# Patient Record
Sex: Female | Born: 1980
Health system: Southern US, Community
[De-identification: ages and names within clinical notes are randomized; demographics above are authoritative.]

## PROBLEM LIST (undated history)

## (undated) DIAGNOSIS — F419 Anxiety disorder, unspecified: Secondary | ICD-10-CM

## (undated) DIAGNOSIS — D649 Anemia, unspecified: Secondary | ICD-10-CM

## (undated) DIAGNOSIS — Z87442 Personal history of urinary calculi: Secondary | ICD-10-CM

## (undated) DIAGNOSIS — Z789 Other specified health status: Secondary | ICD-10-CM

## (undated) HISTORY — PX: KNEE SURGERY: SHX244

## (undated) HISTORY — PX: WISDOM TOOTH EXTRACTION: SHX21

---
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
## 2001-10-30 ENCOUNTER — Other Ambulatory Visit: Admission: RE | Admit: 2001-10-30 | Discharge: 2001-10-30 | Payer: Self-pay | Admitting: Family Medicine

## 2002-01-04 ENCOUNTER — Encounter: Payer: Self-pay | Admitting: Family Medicine

## 2002-01-04 ENCOUNTER — Encounter: Admission: RE | Admit: 2002-01-04 | Discharge: 2002-01-04 | Payer: Self-pay | Admitting: Family Medicine

## 2002-12-26 ENCOUNTER — Other Ambulatory Visit: Admission: RE | Admit: 2002-12-26 | Discharge: 2002-12-26 | Payer: Self-pay | Admitting: Family Medicine

## 2002-12-26 ENCOUNTER — Other Ambulatory Visit: Admission: RE | Admit: 2002-12-26 | Discharge: 2002-12-26 | Payer: Self-pay | Admitting: *Deleted

## 2004-02-12 ENCOUNTER — Other Ambulatory Visit: Admission: RE | Admit: 2004-02-12 | Discharge: 2004-02-12 | Payer: Self-pay | Admitting: Family Medicine

## 2005-04-07 ENCOUNTER — Other Ambulatory Visit: Admission: RE | Admit: 2005-04-07 | Discharge: 2005-04-07 | Payer: Self-pay | Admitting: Family Medicine

## 2006-04-19 ENCOUNTER — Ambulatory Visit (HOSPITAL_COMMUNITY): Admission: RE | Admit: 2006-04-19 | Discharge: 2006-04-19 | Payer: Self-pay | Admitting: Obstetrics and Gynecology

## 2006-07-11 ENCOUNTER — Inpatient Hospital Stay (HOSPITAL_COMMUNITY): Admission: AD | Admit: 2006-07-11 | Discharge: 2006-07-15 | Payer: Self-pay | Admitting: Obstetrics and Gynecology

## 2006-07-12 ENCOUNTER — Encounter (HOSPITAL_COMMUNITY): Payer: Self-pay | Admitting: Obstetrics and Gynecology

## 2007-06-07 ENCOUNTER — Encounter: Admission: RE | Admit: 2007-06-07 | Discharge: 2007-07-30 | Payer: Self-pay | Admitting: Orthopedic Surgery

## 2010-07-09 NOTE — Discharge Summary (Signed)
Julia Burton, Julia Burton             ACCOUNT NO.:  [id number]   MEDICAL RECORD NO.:  [id number]          PATIENT TYPE:  INP   LOCATION:  9107                          FACILITY:  [facility]   PHYSICIAN:  Juluis Mire, M.D.   DATE OF BIRTH:  1981/01/19   DATE OF ADMISSION:  07/11/2006  DATE OF DISCHARGE:  07/15/2006                               DISCHARGE SUMMARY   ADMISSION DIAGNOSES:  1. Intrauterine pregnancy at 40 weeks estimated gestational age.  2. Induction of labor secondary to suspected large for gestational age      infant.   DISCHARGE DIAGNOSIS:  Status post low transverse cesarean section  secondary to failed induction with a viable female infant.   PROCEDURE:  Primary low transverse cesarean section.   REASON FOR ADMISSION:  Please see written H&P.   HOSPITAL COURSE:  This the 30 year old primigravida who was admitted to  [facility] at 40 weeks' estimated gestational age for  induction of labor.  The patient had been seen in the office where  estimated fetal weight was greater than 3800 grams with a grade 3  placenta. On admission vital signs were stable.  Fetal heart tones were  reactive.  Cervix was noted to be closed on admission. The patient was  admitted and started on Pitocin. The patient did become uncomfortable  during the day and had requested an epidural.  She continued on Pitocin  and cervix was now 4-5 cm, 70% effaced, vertex at minus two station.  After several more hours the patient was now comfortable however, there  was no further change in cervix.  Fetal heart tones continued to be  reactive.  Decision was made to proceed with a primary low transverse  cesarean section.  The patient was then transferred to the operating  room where epidural was noted to an adequate surgical level.  Low  transverse incision was made with delivery of a viable female infant  weighing 8 pounds 5 ounces with Apgars of 9 at 1 minute and 9 at 5  minutes.  The patient  tolerated procedure well and taken to the recovery  room in stable condition.  On postoperative day #1 the patient was  without complaint.  Vital signs were stable.  Temperature max of 100.2  at 6:00 a.m. and 100.5 currently.  Abdomen soft with good return of  bowel function.  Fundus was firm and nontender.  Abdominal dressing was  noted to be clean, dry and intact.  Laboratory findings revealed  hemoglobin of 10.1, platelet count 197,000, WBC count of 16.3.  Temperature was rechecked 2 hours later noted to be 98.7.  Decision was  made just to continue to observe the patient. On postoperative day #2  the patient was without complaint.  Vital signs stable.  She is  afebrile.  Fundus firm and nontender.  Abdominal dressing been removed  revealing incision that is clean, dry and intact.  Postoperative day #3  the patient was without complaint.  Vital signs remained stable.  Fundus  firm and nontender.  Incision was clean, dry and intact.  Staples  removed and the  patient was later discharged home.   CONDITION ON DISCHARGE:  Stable, diet regular as tolerated.   ACTIVITY:  No heavy lifting, no driving x2 weeks.  No vaginal entry.   FOLLOW UP:  Patient follow up in the office in 1 week for an incision  check.  She is to call for temperature greater than 100 degrees,  persistent nausea, vomiting, heavy vaginal bleeding and/or redness or  drainage from the incisional site.   DISCHARGE MEDICATIONS:  1. Tylox #30 one p.o. every 4-6 hours.  2. Motrin 600 mg every 6 hours.  3. Prenatal vitamins one p.o. daily.      Julio Sicks, N.P.      Juluis Mire, M.D.  Electronically Signed    CC/MEDQ  D:  08/20/2006  T:  08/21/2006  Job:  [id number]

## 2010-07-09 NOTE — Op Note (Signed)
Julia Burton, Julia Burton             ACCOUNT NO.:  [id number]   MEDICAL RECORD NO.:  [id number]          PATIENT TYPE:  INP   LOCATION:  9107                          FACILITY:  [facility]   PHYSICIAN:  Zelphia Cairo, MD    DATE OF BIRTH:  1980/05/29   DATE OF PROCEDURE:  07/12/2006  DATE OF DISCHARGE:                               OPERATIVE REPORT   PREOPERATIVE DIAGNOSIS:  1. Failed induction.  2. Intrauterine pregnancy at 40 weeks.   POSTOPERATIVE DIAGNOSIS:  1. Failed induction.  2. Intrauterine pregnancy at 40 weeks.   PROCEDURE:  Primary low transverse cesarean delivery.   SURGEON:  Dr. Renaldo Fiddler   ASSISTANT:  None.   ANESTHESIA:  Epidural.   SPECIMEN:  Placenta to pathology.   FINDINGS:  Viable female infant with Apgars of nine and nine, weight 8  pounds 5 ounces, normal-appearing pelvic anatomy.  Estimated blood loss  500 mL.  Complications none.  Condition stable to recovery room.   PROCEDURE:  The patient was taken to the operating room where epidural  was found to be adequate.  She was prepped and draped in sterile  fashion.  A Pfannenstiel skin incision was made with a scalpel and  carried down to the underlying fascia.  The fascia was incised in the  midline and extended laterally using Mayo scissors.  Kocher clamps were  used to grasp the anterior portion of the fascia.  This was tented  upwards and the underlying rectus muscles were dissected off using the  Bovie.  The inferior portion of the fascia was then tented upwards with  Kocher clamps and the underlying rectus dissected off with the Bovie.  Peritoneum was then identified and entered bluntly.  This was extended  using surgeon and assistants hands.  Bladder blade was then inserted and  the vesicouterine peritoneum was incised and the bladder flap was  created.  The bladder blade was reinserted to protect the bladder flap.  Uterine incision was then made with a scalpel and extended bluntly.  The  fetal vertex  was brought to the uterine incision. Due to inability to  deliver the fetal vertex with fundal pressure alone the vacuum was  applied. Gentle traction was applied upward on the fetal vertex along  with fundal pressure and the fetal vertex was easily delivered. The  vacuum was removed.  Nuchal cord times one was easily reduced.  The  shoulders and body then followed. The mouth and nose were suctioned as  the cord was clamped and cut. The infant was taken to the awaiting  pediatric staff.   The placenta was then manually removed from the uterus.  The uterus was  then exteriorized from the pelvis and cleared of all clots and debris  using a dry lap sponge.  The uterine incision was then closed using 1-0  chromic in a running locked fashion.  Hemostasis was assured and the  uterus was placed back into the pelvic cavity.  The pelvis was copiously  irrigated with warm normal saline.  The uterine incision was reinspected  to assure hemostasis.  Once  hemostasis was assured.  The peritoneum was reapproximated using Vicryl.  The fascia was closed with 0 Monocryl and the skin was closed with  staples.  The patient tolerated the procedure well.  Sponge, lap, needle  and instrument counts were correct x2.  She was given Ancef at cord  clamp.      Zelphia Cairo, MD  Electronically Signed     GA/MEDQ  D:  07/12/2006  T:  07/12/2006  Job:  [id number]

## 2010-09-07 ENCOUNTER — Other Ambulatory Visit: Payer: Self-pay | Admitting: Physician Assistant

## 2010-09-07 DIAGNOSIS — D239 Other benign neoplasm of skin, unspecified: Secondary | ICD-10-CM

## 2010-09-07 HISTORY — DX: Other benign neoplasm of skin, unspecified: D23.9

## 2011-10-31 ENCOUNTER — Ambulatory Visit: Payer: Self-pay | Admitting: Physical Therapy

## 2013-01-18 ENCOUNTER — Encounter (INDEPENDENT_AMBULATORY_CARE_PROVIDER_SITE_OTHER): Payer: Self-pay

## 2013-01-18 ENCOUNTER — Ambulatory Visit (INDEPENDENT_AMBULATORY_CARE_PROVIDER_SITE_OTHER): Payer: BC Managed Care – PPO | Admitting: General Practice

## 2013-01-18 ENCOUNTER — Encounter: Payer: Self-pay | Admitting: General Practice

## 2013-01-18 VITALS — BP 110/73 | HR 80 | Temp 97.8°F | Ht 68.0 in | Wt 186.0 lb

## 2013-01-18 DIAGNOSIS — J020 Streptococcal pharyngitis: Secondary | ICD-10-CM

## 2013-01-18 DIAGNOSIS — J029 Acute pharyngitis, unspecified: Secondary | ICD-10-CM

## 2013-01-18 LAB — POCT RAPID STREP A (OFFICE): Rapid Strep A Screen: POSITIVE — AB

## 2013-01-18 MED ORDER — AMOXICILLIN 875 MG PO TABS: 875.0000 mg | ORAL_TABLET | Freq: Two times a day (BID) | ORAL | Status: DC

## 2013-01-18 NOTE — Patient Instructions (Signed)
Strep Throat  Strep throat is an infection of the throat caused by a bacteria named Streptococcus pyogenes. Your caregiver may call the infection streptococcal "tonsillitis" or "pharyngitis" depending on whether there are signs of inflammation in the tonsils or back of the throat. Strep throat is most common in children aged 31 15 years during the cold months of the year, but it can occur in people of any age during any season. This infection is spread from person to person (contagious) through coughing, sneezing, or other close contact.  SYMPTOMS   · Fever or chills.  · Painful, swollen, red tonsils or throat.  · Pain or difficulty when swallowing.  · White or yellow spots on the tonsils or throat.  · Swollen, tender lymph nodes or "glands" of the neck or under the jaw.  · Red rash all over the body (rare).  DIAGNOSIS   Many different infections can cause the same symptoms. A test must be done to confirm the diagnosis so the right treatment can be given. A "rapid strep test" can help your caregiver make the diagnosis in a few minutes. If this test is not available, a light swab of the infected area can be used for a throat culture test. If a throat culture test is done, results are usually available in a day or two.  TREATMENT   Strep throat is treated with antibiotic medicine.  HOME CARE INSTRUCTIONS   · Gargle with 1 tsp of salt in 1 cup of warm water, 3 4 times per day or as needed for comfort.  · Family members who also have a sore throat or fever should be tested for strep throat and treated with antibiotics if they have the strep infection.  · Make sure everyone in your household washes their hands well.  · Do not share food, drinking cups, or personal items that could cause the infection to spread to others.  · You may need to eat a soft food diet until your sore throat gets better.  · Drink enough water and fluids to keep your urine clear or pale yellow. This will help prevent dehydration.  · Get plenty of  rest.  · Stay home from school, daycare, or work until you have been on antibiotics for 24 hours.  · Only take over-the-counter or prescription medicines for pain, discomfort, or fever as directed by your caregiver.  · If antibiotics are prescribed, take them as directed. Finish them even if you start to feel better.  SEEK MEDICAL CARE IF:   · The glands in your neck continue to enlarge.  · You develop a rash, cough, or earache.  · You cough up green, yellow-brown, or bloody sputum.  · You have pain or discomfort not controlled by medicines.  · Your problems seem to be getting worse rather than better.  SEEK IMMEDIATE MEDICAL CARE IF:   · You develop any new symptoms such as vomiting, severe headache, stiff or painful neck, chest pain, shortness of breath, or trouble swallowing.  · You develop severe throat pain, drooling, or changes in your voice.  · You develop swelling of the neck, or the skin on the neck becomes red and tender.  · You have a fever.  · You develop signs of dehydration, such as fatigue, dry mouth, and decreased urination.  · You become increasingly sleepy, or you cannot wake up completely.  Document Released: 02/05/2000 Document Revised: 01/25/2012 Document Reviewed: 04/08/2010  ExitCare® Patient Information ©2014 [employer].

## 2013-01-18 NOTE — Progress Notes (Signed)
   Subjective:    Patient ID: Julia Burton, female    DOB: Apr 09, 1980, 32 y.o.   MRN: [id number]  Sore Throat  This is a new problem. The current episode started in the past 7 days. The problem has been gradually worsening. Neither side of throat is experiencing more pain than the other. There has been no fever. The pain is at a severity of 7/10. The pain is moderate. Pertinent negatives include no abdominal pain, congestion, coughing, diarrhea, ear pain, headaches, plugged ear sensation, shortness of breath, trouble swallowing or vomiting. She has tried cool liquids for the symptoms. The treatment provided no relief.      Review of Systems  Constitutional: Positive for fever. Negative for chills.       Low grade fever 100.1  HENT: Positive for sore throat. Negative for congestion, ear pain, postnasal drip, sinus pressure and trouble swallowing.   Respiratory: Negative for cough, chest tightness and shortness of breath.   Cardiovascular: Negative for chest pain and palpitations.  Gastrointestinal: Negative for vomiting, abdominal pain and diarrhea.  Neurological: Negative for dizziness, weakness and headaches.       Objective:   Physical Exam  Constitutional: She is oriented to person, place, and time. She appears well-developed and well-nourished.  HENT:  Head: Normocephalic and atraumatic.  Right Ear: External ear normal.  Left Ear: External ear normal.  Nose: Nose normal.  Mouth/Throat: Posterior oropharyngeal erythema present.  Cardiovascular: Normal rate, regular rhythm and normal heart sounds.   Pulmonary/Chest: Effort normal and breath sounds normal. No respiratory distress. She exhibits no tenderness.  Neurological: She is alert and oriented to person, place, and time.  Skin: Skin is warm and dry.  Psychiatric: She has a normal mood and affect.          Assessment & Plan:  1. Sore throat  - POCT rapid strep A  2. Strep throat  - amoxicillin (AMOXIL) 875  MG tablet; Take 1 tablet (875 mg total) by mouth 2 (two) times daily.  Dispense: 20 tablet; Refill: 0 -Increase fluid intake Motrin or tylenol OTC Throat lozenges if help New toothbrush in 3 days Proper handwashing RTO if symptoms worsen or unresolved Patient verbalized understanding Coralie Keens, FNP-C

## 2013-04-01 LAB — OB RESULTS CONSOLE ABO/RH: RH Type: NEGATIVE

## 2013-04-01 LAB — OB RESULTS CONSOLE HEPATITIS B SURFACE ANTIGEN: Hepatitis B Surface Ag: NEGATIVE

## 2013-04-01 LAB — OB RESULTS CONSOLE RPR: RPR: NONREACTIVE

## 2013-04-01 LAB — OB RESULTS CONSOLE GC/CHLAMYDIA
CHLAMYDIA, DNA PROBE: NEGATIVE
GC PROBE AMP, GENITAL: NEGATIVE

## 2013-04-01 LAB — OB RESULTS CONSOLE HIV ANTIBODY (ROUTINE TESTING): HIV: NONREACTIVE

## 2013-04-01 LAB — OB RESULTS CONSOLE RUBELLA ANTIBODY, IGM: RUBELLA: IMMUNE

## 2013-10-18 ENCOUNTER — Inpatient Hospital Stay (HOSPITAL_COMMUNITY)
Admission: AD | Admit: 2013-10-18 | Discharge: 2013-10-18 | Disposition: A | Discharge status: Home / Self Care | Payer: BC Managed Care – PPO | Source: Ambulatory Visit | Attending: Obstetrics and Gynecology | Admitting: Obstetrics and Gynecology

## 2013-10-18 ENCOUNTER — Encounter (HOSPITAL_COMMUNITY): Payer: Self-pay | Admitting: *Deleted

## 2013-10-18 DIAGNOSIS — O2390 Unspecified genitourinary tract infection in pregnancy, unspecified trimester: Secondary | ICD-10-CM | POA: Diagnosis not present

## 2013-10-18 DIAGNOSIS — M545 Low back pain, unspecified: Secondary | ICD-10-CM | POA: Insufficient documentation

## 2013-10-18 DIAGNOSIS — O26839 Pregnancy related renal disease, unspecified trimester: Secondary | ICD-10-CM | POA: Diagnosis not present

## 2013-10-18 DIAGNOSIS — N390 Urinary tract infection, site not specified: Secondary | ICD-10-CM

## 2013-10-18 DIAGNOSIS — O1213 Gestational proteinuria, third trimester: Secondary | ICD-10-CM

## 2013-10-18 DIAGNOSIS — O2343 Unspecified infection of urinary tract in pregnancy, third trimester: Secondary | ICD-10-CM

## 2013-10-18 HISTORY — DX: Anemia, unspecified: D64.9

## 2013-10-18 LAB — COMPREHENSIVE METABOLIC PANEL
ALBUMIN: 2.9 g/dL — AB (ref 3.5–5.2)
ALT: 10 U/L (ref 0–35)
ANION GAP: 14 (ref 5–15)
AST: 14 U/L (ref 0–37)
Alkaline Phosphatase: 115 U/L (ref 39–117)
BILIRUBIN TOTAL: 0.3 mg/dL (ref 0.3–1.2)
BUN: 6 mg/dL (ref 6–23)
CO2: 21 mEq/L (ref 19–32)
Calcium: 9.1 mg/dL (ref 8.4–10.5)
Chloride: 101 mEq/L (ref 96–112)
Creatinine, Ser: 0.61 mg/dL (ref 0.50–1.10)
GFR calc Af Amer: >90 mL/min (ref >90)
GFR calc non Af Amer: >90 mL/min (ref >90)
Glucose, Bld: 70 mg/dL (ref 70–99)
Potassium: 4.2 mEq/L (ref 3.7–5.3)
SODIUM: 136 meq/L — AB (ref 137–147)
TOTAL PROTEIN: 6 g/dL (ref 6.0–8.3)

## 2013-10-18 LAB — URINE MICROSCOPIC-ADD ON

## 2013-10-18 LAB — URINALYSIS, ROUTINE W REFLEX MICROSCOPIC
Bilirubin Urine: NEGATIVE
Glucose, UA: NEGATIVE mg/dL
Ketones, ur: >80 mg/dL — AB
Nitrite: NEGATIVE
Protein, ur: 100 mg/dL — AB
Specific Gravity, Urine: 1.025 (ref 1.005–1.030)
Urobilinogen, UA: 1 mg/dL (ref 0.0–1.0)
pH: 6.5 (ref 5.0–8.0)

## 2013-10-18 LAB — CBC WITH DIFFERENTIAL/PLATELET
BASOS PCT: 0 % (ref 0–1)
Basophils Absolute: 0 10*3/uL (ref 0.0–0.1)
EOS ABS: 0 10*3/uL (ref 0.0–0.7)
Eosinophils Relative: 0 % (ref 0–5)
HCT: 35.3 % — ABNORMAL LOW (ref 36.0–46.0)
Hemoglobin: 11.6 g/dL — ABNORMAL LOW (ref 12.0–15.0)
Lymphocytes Relative: 14 % (ref 12–46)
Lymphs Abs: 2.3 10*3/uL (ref 0.7–4.0)
MCH: 30.8 pg (ref 26.0–34.0)
MCHC: 32.9 g/dL (ref 30.0–36.0)
MCV: 93.6 fL (ref 78.0–100.0)
Monocytes Absolute: 1.2 10*3/uL — ABNORMAL HIGH (ref 0.1–1.0)
Monocytes Relative: 8 % (ref 3–12)
NEUTROS ABS: 12.7 10*3/uL — AB (ref 1.7–7.7)
NEUTROS PCT: 78 % — AB (ref 43–77)
PLATELETS: 223 10*3/uL (ref 150–400)
RBC: 3.77 MIL/uL — AB (ref 3.87–5.11)
RDW: 13.7 % (ref 11.5–15.5)
WBC: 16.2 10*3/uL — ABNORMAL HIGH (ref 4.0–10.5)

## 2013-10-18 LAB — LACTATE DEHYDROGENASE: LDH: 145 U/L (ref 94–250)

## 2013-10-18 MED ORDER — AMOXICILLIN 875 MG PO TABS: 875.0000 mg | ORAL_TABLET | Freq: Two times a day (BID) | ORAL | Status: DC

## 2013-10-18 NOTE — Discharge Instructions (Signed)
Asymptomatic Bacteriuria Asymptomatic bacteriuria is the presence of a large number of bacteria in your urine without the usual symptoms of burning or frequent urination. The following conditions increase the risk of asymptomatic bacteriuria:  Diabetes mellitus.  Advanced age.  Pregnancy in the first trimester.  Kidney stones.  Kidney transplants.  Leaky kidney tube valve in young children (reflux). Treatment for this condition is not needed in most people and can lead to other problems such as too much yeast and growth of resistant bacteria. However, some people, such as pregnant women, do need treatment to prevent kidney infection. Asymptomatic bacteriuria in pregnancy is also associated with fetal growth restriction, premature labor, and newborn death. HOME CARE INSTRUCTIONS Monitor your condition for any changes. The following actions may help to relieve any discomfort you are feeling:  Drink enough water and fluids to keep your urine clear or pale yellow. Go to the bathroom more often to keep your bladder empty.  Keep the area around your vagina and rectum clean. Wipe yourself from front to back after urinating. SEEK IMMEDIATE MEDICAL CARE IF:  You develop signs of an infection such as:  Burning with urination.  Frequency of voiding.  Back pain.  Fever.  You have blood in the urine.  You develop a fever. MAKE SURE YOU:  Understand these instructions.  Will watch your condition.  Will get help right away if you are not doing well or get worse. Document Released: 02/07/2005 Document Revised: 06/24/2013 Document Reviewed: 07/30/2012 ExitCare Patient Information 2015 [employer]. This information is not intended to replace advice given to you by your health care provider. Make sure you discuss any questions you have with your health care provider.  

## 2013-10-18 NOTE — MAU Note (Signed)
Lower back pain x 2 days, noted blood in toilet with voiding this afternoon.  Was advised by office nurse to come to MAU.  Having occasional uc's.  Decreased FM today.

## 2013-10-18 NOTE — MAU Provider Note (Signed)
History     CSN: [id number]  Arrival date and time: 10/18/13 1558   None     Chief Complaint  Patient presents with  . Back Pain  . Vaginal Bleeding   HPI Julia Burton is 33 y.o. G2P1001 [redacted]w[redacted]d weeks presenting with report of seeing blood on tissue after lunch.  Same thing happened last week but today it continues.  Is having some abdominal and lower back pain  She is a patient of Dr. Christen Butter.  Has C-Section planned for 11/06/13. Had spotting early in pregnancy but otherwise, uncomplicated per patient report.  Had cervix check in office 4 days ago-told it was soft but not dilated.  Hx of kidney stone X 1.    Past Medical History  Diagnosis Date  . Anemia     Past Surgical History  Procedure Laterality Date  . Cesarean section    . Knee surgery      History reviewed. No pertinent family history.  History  Substance Use Topics  . Smoking status: Never Smoker   . Smokeless tobacco: Not on file  . Alcohol Use: No    Allergies: No Known Allergies  Prescriptions prior to admission  Medication Sig Dispense Refill  . amoxicillin (AMOXIL) 875 MG tablet Take 1 tablet (875 mg total) by mouth 2 (two) times daily.  20 tablet  0    Review of Systems  Constitutional: Negative for fever and chills.  Eyes: Negative for blurred vision, double vision and photophobia.  Cardiovascular: Negative for chest pain.       Mild pedal edema  Genitourinary:       Blood on tissue after urination several time this afternoon.  Neurological: Positive for headaches (mild this am).   Physical Exam   Blood pressure 125/83, pulse 98, temperature 98.1 F (36.7 C), temperature source Oral, resp. rate 18, height 5\' 8"  (1.727 m), weight 215 lb (97.523 kg).  Physical Exam  Constitutional: She is oriented to person, place, and time. She appears well-developed and well-nourished.  HENT:  Head: Normocephalic.  Neck: Normal range of motion.  Cardiovascular: Normal rate.   Respiratory: Effort  normal.  Genitourinary:  Cervical exam by Cecille Rubin, RN--soft 1 cm dilated.  Musculoskeletal: She exhibits edema (mild pedal edema).  Neurological: She is alert and oriented to person, place, and time.  Skin: Skin is warm and dry.  Psychiatric: She has a normal mood and affect. Her behavior is normal.   Results for orders placed during the hospital encounter of 10/18/13 (from the past 24 hour(s))  URINALYSIS, ROUTINE W REFLEX MICROSCOPIC     Status: Abnormal   Collection Time    10/18/13  4:25 PM      Result Value Ref Range   Color, Urine BROWN (*) YELLOW   APPearance HAZY (*) CLEAR   Specific Gravity, Urine 1.025  1.005 - 1.030   pH 6.5  5.0 - 8.0   Glucose, UA NEGATIVE  NEGATIVE mg/dL   Hgb urine dipstick LARGE (*) NEGATIVE   Bilirubin Urine NEGATIVE  NEGATIVE   Ketones, ur >80 (*) NEGATIVE mg/dL   Protein, ur 100 (*) NEGATIVE mg/dL   Urobilinogen, UA 1.0  0.0 - 1.0 mg/dL   Nitrite NEGATIVE  NEGATIVE   Leukocytes, UA TRACE (*) NEGATIVE  URINE MICROSCOPIC-ADD ON     Status: Abnormal   Collection Time    10/18/13  4:25 PM      Result Value Ref Range   Squamous Epithelial / LPF MANY (*) RARE  Urine-Other FIELD OBSCURED BY RBC'S    CBC WITH DIFFERENTIAL     Status: Abnormal   Collection Time    10/18/13  6:30 PM      Result Value Ref Range   WBC 16.2 (*) 4.0 - 10.5 K/uL   RBC 3.77 (*) 3.87 - 5.11 MIL/uL   Hemoglobin 11.6 (*) 12.0 - 15.0 g/dL   HCT 35.3 (*) 36.0 - 46.0 %   MCV 93.6  78.0 - 100.0 fL   MCH 30.8  26.0 - 34.0 pg   MCHC 32.9  30.0 - 36.0 g/dL   RDW 13.7  11.5 - 15.5 %   Platelets 223  150 - 400 K/uL   Neutrophils Relative % 78 (*) 43 - 77 %   Neutro Abs 12.7 (*) 1.7 - 7.7 K/uL   Lymphocytes Relative 14  12 - 46 %   Lymphs Abs 2.3  0.7 - 4.0 K/uL   Monocytes Relative 8  3 - 12 %   Monocytes Absolute 1.2 (*) 0.1 - 1.0 K/uL   Eosinophils Relative 0  0 - 5 %   Eosinophils Absolute 0.0  0.0 - 0.7 K/uL   Basophils Relative 0  0 - 1 %   Basophils Absolute 0.0  0.0 -  0.1 K/uL  COMPREHENSIVE METABOLIC PANEL     Status: Abnormal   Collection Time    10/18/13  6:30 PM      Result Value Ref Range   Sodium 136 (*) 137 - 147 mEq/L   Potassium 4.2  3.7 - 5.3 mEq/L   Chloride 101  96 - 112 mEq/L   CO2 21  19 - 32 mEq/L   Glucose, Bld 70  70 - 99 mg/dL   BUN 6  6 - 23 mg/dL   Creatinine, Ser 0.61  0.50 - 1.10 mg/dL   Calcium 9.1  8.4 - 10.5 mg/dL   Total Protein 6.0  6.0 - 8.3 g/dL   Albumin 2.9 (*) 3.5 - 5.2 g/dL   AST 14  0 - 37 U/L   ALT 10  0 - 35 U/L   Alkaline Phosphatase 115  39 - 117 U/L   Total Bilirubin 0.3  0.3 - 1.2 mg/dL   GFR calc non Af Amer >90  >90 mL/min   GFR calc Af Amer >90  >90 mL/min   Anion gap 14  5 - 15  LACTATE DEHYDROGENASE     Status: None   Collection Time    10/18/13  6:30 PM      Result Value Ref Range   LDH 145  94 - 250 U/L   MAU Course  Procedures  Urine culture to lab  MDM Dr. Helane Rima was in unit, reported cervical exam and FMS at that time When UA results-called to report--orders given for aggressive po hydration, PIH labs and Urine culture.  If all ok, may discharge to home to hydrate well over the weekend and f/u in office on Monday.' 19:30  Reported labs to Dr. Helane Rima.  Treat for possible UTI with Amoxil 875mg  bid X 1 week.  Patient has appt on Monday afternoon with Dr. Corinna Capra.  Assessment and Plan  A:  Possible UTI      [redacted] week gestation      Proteinuria--PIH ruled out  P:  Urine culture pending       Instructed patient to call for headache, chest or leg pain, increased swelling, vaginal bleeding, LOF, or decreased fetal movement  Rx for Amoxil to pharmacy       Keep appt for Monday  Hartford Maulden,EVE M 10/18/2013, 7:25 PM

## 2013-10-20 ENCOUNTER — Inpatient Hospital Stay (HOSPITAL_COMMUNITY): Payer: BC Managed Care – PPO

## 2013-10-20 ENCOUNTER — Encounter (HOSPITAL_COMMUNITY): Payer: Self-pay

## 2013-10-20 ENCOUNTER — Inpatient Hospital Stay (HOSPITAL_COMMUNITY)
Admission: AD | Admit: 2013-10-20 | Discharge: 2013-10-20 | Disposition: A | Discharge status: Home / Self Care | Payer: BC Managed Care – PPO | Source: Ambulatory Visit | Attending: Obstetrics and Gynecology | Admitting: Obstetrics and Gynecology

## 2013-10-20 DIAGNOSIS — O9989 Other specified diseases and conditions complicating pregnancy, childbirth and the puerperium: Principal | ICD-10-CM

## 2013-10-20 DIAGNOSIS — R109 Unspecified abdominal pain: Secondary | ICD-10-CM | POA: Diagnosis present

## 2013-10-20 DIAGNOSIS — O99891 Other specified diseases and conditions complicating pregnancy: Secondary | ICD-10-CM | POA: Diagnosis not present

## 2013-10-20 DIAGNOSIS — Z87442 Personal history of urinary calculi: Secondary | ICD-10-CM | POA: Insufficient documentation

## 2013-10-20 HISTORY — DX: Personal history of urinary calculi: Z87.442

## 2013-10-20 LAB — URINALYSIS, ROUTINE W REFLEX MICROSCOPIC
BILIRUBIN URINE: NEGATIVE
Glucose, UA: NEGATIVE mg/dL
KETONES UR: NEGATIVE mg/dL
NITRITE: NEGATIVE
PH: 6.5 (ref 5.0–8.0)
PROTEIN: NEGATIVE mg/dL
Specific Gravity, Urine: <1.005 — ABNORMAL LOW (ref 1.005–1.030)
Urobilinogen, UA: 0.2 mg/dL (ref 0.0–1.0)

## 2013-10-20 LAB — CULTURE, OB URINE
Colony Count: 40000
SPECIAL REQUESTS: NORMAL

## 2013-10-20 LAB — URINE MICROSCOPIC-ADD ON

## 2013-10-20 MED ORDER — OXYCODONE-ACETAMINOPHEN 5-325 MG PO TABS
1.0000 | ORAL_TABLET | Freq: Once | ORAL | Status: AC
Start: 2013-10-20 — End: 2013-10-20
  Administered 2013-10-20: 1 via ORAL
  Filled 2013-10-20: qty 1

## 2013-10-20 MED ORDER — PROMETHAZINE HCL 25 MG PO TABS: 25.0000 mg | ORAL_TABLET | Freq: Four times a day (QID) | ORAL | Status: DC | PRN

## 2013-10-20 MED ORDER — OXYCODONE-ACETAMINOPHEN 5-325 MG PO TABS: 2.0000 | ORAL_TABLET | ORAL | Status: DC | PRN

## 2013-10-20 MED ORDER — PROMETHAZINE HCL 25 MG PO TABS
25.0000 mg | ORAL_TABLET | Freq: Once | ORAL | Status: AC
  Administered 2013-10-20: 25 mg via ORAL
  Filled 2013-10-20: qty 1

## 2013-10-20 NOTE — MAU Note (Signed)
Pt states having back pain for past few days, saw MD at office Monday, however came to MAU Friday night. Did note blood in urine, MD thought may have kidney stone. Denies vaginal bleeding or abnormal vaginal discharge.

## 2013-10-20 NOTE — MAU Provider Note (Signed)
History     CSN: [id number]  Arrival date and time: 10/20/13 1126   First Provider Initiated Contact with Patient 10/20/13 1245      Chief Complaint  Patient presents with  . Back Pain   HPI Comments: Julia Burton 33 y.o. G2P1001 [redacted]w[redacted]d presents to MAU with left flank pain and possibility of kidney stone. She has had blood in her urine x 1 week and started having severe pain this morning at 6:30 am. This feels just like the pain she experienced with her other kidney stone. She took tylenol at 9 am and it was no help. Dr Helane Rima has ordered Burton Ultrasound. She is planned C/S for 11/06/13. She has an appointment with Dr Corinna Capra tomorrow  Back Pain      Past Medical History  Diagnosis Date  . Anemia   . History of kidney stones     Past Surgical History  Procedure Laterality Date  . Knee surgery    . Cesarean section      FTP  . Wisdom tooth extraction      History reviewed. No pertinent family history.  History  Substance Use Topics  . Smoking status: Never Smoker   . Smokeless tobacco: Never Used  . Alcohol Use: No    Allergies: No Known Allergies  Prescriptions prior to admission  Medication Sig Dispense Refill  . acetaminophen (TYLENOL) 500 MG tablet Take 500 mg by mouth every 6 (six) hours as needed for mild pain or moderate pain.      Marland Kitchen amoxicillin (AMOXIL) 875 MG tablet Take 875 mg by mouth 2 (two) times daily. Pt started on 10/18/13 to take for 7 days.      . calcium carbonate (TUMS - DOSED IN MG ELEMENTAL CALCIUM) 500 MG chewable tablet Chew 1 tablet by mouth daily as needed for indigestion or heartburn.      . ferrous sulfate 325 (65 FE) MG tablet Take 325 mg by mouth daily with breakfast.      . Prenatal Vit-Fe Fumarate-FA (PRENATAL MULTIVITAMIN) TABS tablet Take 1 tablet by mouth daily at 12 noon.        Review of Systems  Constitutional: Negative.   HENT: Negative.   Eyes: Negative.   Respiratory: Negative.   Cardiovascular: Negative.    Gastrointestinal: Negative.   Genitourinary: Negative.   Musculoskeletal: Positive for back pain.  Skin: Negative.   Neurological: Negative.   Endo/Heme/Allergies: Negative.   Psychiatric/Behavioral: Negative.    Physical Exam   Blood pressure 120/74, pulse 84, temperature 98.2 F (36.8 C), temperature source Oral, resp. rate 20, height 5\' 7"  (1.702 m), weight 214 lb (97.07 kg).  Physical Exam  Constitutional: She is oriented to person, place, and time. She appears well-developed and well-nourished. No distress.  No Fevers  HENT:  Head: Normocephalic and atraumatic.  Cardiovascular: Normal rate, regular rhythm and normal heart sounds.   Respiratory: Effort normal and breath sounds normal. No respiratory distress. She has no wheezes. She has no rales. She exhibits no tenderness.  GI: Soft. She exhibits no distension. There is no tenderness.  Genitourinary:  Not examined  Musculoskeletal: She exhibits tenderness.  Left flank pain  Neurological: She is alert and oriented to person, place, and time.  Skin: Skin is warm and dry.  Psychiatric: She has a normal mood and affect. Her behavior is normal. Judgment and thought content normal.   Results for orders placed during the hospital encounter of 10/20/13 (from the past 24 hour(s))  URINALYSIS, ROUTINE W  REFLEX MICROSCOPIC     Status: Abnormal   Collection Time    10/20/13 11:40 AM      Result Value Ref Range   Color, Urine YELLOW  YELLOW   APPearance CLEAR  CLEAR   Specific Gravity, Urine <1.005 (*) 1.005 - 1.030   pH 6.5  5.0 - 8.0   Glucose, UA NEGATIVE  NEGATIVE mg/dL   Hgb urine dipstick LARGE (*) NEGATIVE   Bilirubin Urine NEGATIVE  NEGATIVE   Ketones, ur NEGATIVE  NEGATIVE mg/dL   Protein, ur NEGATIVE  NEGATIVE mg/dL   Urobilinogen, UA 0.2  0.0 - 1.0 mg/dL   Nitrite NEGATIVE  NEGATIVE   Leukocytes, UA MODERATE (*) NEGATIVE  URINE MICROSCOPIC-ADD ON     Status: Abnormal   Collection Time    10/20/13 11:40 AM       Result Value Ref Range   Squamous Epithelial / LPF MANY (*) RARE   WBC, UA 3-6  <3 WBC/hpf   RBC / HPF TOO NUMEROUS TO COUNT  <3 RBC/hpf   Crystals CA OXALATE CRYSTALS (*) NEGATIVE   Urine-Other MUCOUS PRESENT     Julia Burton  10/20/2013   CLINICAL DATA:  33 year old pregnant female with back pain and hematuria. History of urinary calculi. Estimated gestational age of [redacted] weeks.  EXAM: Burton/URINARY TRACT ULTRASOUND COMPLETE  COMPARISON:  None.  FINDINGS: Right Kidney:  Length: 12.1 cm. Mild hydronephrosis is identified. Normal Burton echogenicity noted. There is no evidence of solid mass or definite Burton calculi.  Left Kidney:  Length: 12.2 cm. Mild hydronephrosis is identified. Normal Burton echogenicity noted. There is no evidence of solid mass or definite Burton calculi.  Bladder:  Appears normal for degree of bladder distention. The ureteral jets were difficult to visualize secondary to technical factors.  IMPRESSION: Mild bilateral hydronephrosis - most likely secondary to hydronephrosis of pregnancy, but an obstructing calculus is difficult to entirely exclude.  No other significant abnormalities.   Electronically Signed   By: Hassan Rowan M.D.   On: 10/20/2013 14:16    MAU Course  Procedures  MDM Burton ultrasound Percocet/ Phenergan Reviewed case with Dr Helane Rima who agreed with plan of care  Assessment and Plan   A: Left flank pain Possible kidney stone  P: Percocet/ phenergan Hat to strain urine Hydrate well Continue on antibiotics until told otherwise Follow up with Dr Corinna Capra tomorrow Return to MAU as needed  [name] 10/20/2013, 12:53 PM

## 2013-10-20 NOTE — Discharge Instructions (Signed)

## 2013-10-24 ENCOUNTER — Encounter (HOSPITAL_COMMUNITY): Payer: Self-pay | Admitting: Pharmacist

## 2013-11-01 ENCOUNTER — Encounter (HOSPITAL_COMMUNITY): Payer: Self-pay

## 2013-11-04 ENCOUNTER — Encounter (HOSPITAL_COMMUNITY): Payer: Self-pay

## 2013-11-04 ENCOUNTER — Encounter (HOSPITAL_COMMUNITY)
Admission: RE | Admit: 2013-11-04 | Discharge: 2013-11-04 | Disposition: A | Discharge status: Home / Self Care | Payer: BC Managed Care – PPO | Source: Ambulatory Visit | Attending: Obstetrics and Gynecology | Admitting: Obstetrics and Gynecology

## 2013-11-04 HISTORY — DX: Anxiety disorder, unspecified: F41.9

## 2013-11-04 HISTORY — DX: Other specified health status: Z78.9

## 2013-11-04 LAB — CBC
HCT: 35.5 % — ABNORMAL LOW (ref 36.0–46.0)
Hemoglobin: 11.8 g/dL — ABNORMAL LOW (ref 12.0–15.0)
MCH: 31.2 pg (ref 26.0–34.0)
MCHC: 33.2 g/dL (ref 30.0–36.0)
MCV: 93.9 fL (ref 78.0–100.0)
Platelets: 222 10*3/uL (ref 150–400)
RBC: 3.78 MIL/uL — AB (ref 3.87–5.11)
RDW: 13.8 % (ref 11.5–15.5)
WBC: 11.9 10*3/uL — ABNORMAL HIGH (ref 4.0–10.5)

## 2013-11-04 LAB — TYPE AND SCREEN
ABO/RH(D): O NEG
ANTIBODY SCREEN: NEGATIVE

## 2013-11-04 LAB — RPR

## 2013-11-04 NOTE — Patient Instructions (Addendum)
Your procedure is scheduled on: 11/06/13  Enter through the Main Entrance at : Pick up desk phone and dial [phone] and inform us of your arrival.  Please call [phone] if you have any problems the morning of surgery.  Remember: Do not eat food or drink liquids, including water, after midnight: Clear liquids are ok until:   You may brush your teeth the morning of surgery.  Take these meds the morning of surgery with a sip of water:  DO NOT wear jewelry, eye make-up, lipstick,body lotion, or dark fingernail polish.  (Polished toes are ok) You may wear deodorant.  If you are to be admitted after surgery, leave suitcase in car until your room has been assigned. Patients discharged on the day of surgery will not be allowed to drive home. Wear loose fitting, comfortable clothes for your ride home.

## 2013-11-04 NOTE — Patient Instructions (Addendum)
Your procedure is scheduled on: 11/06/13  Enter through the Main Entrance at : Pick up desk phone and dial [phone] and inform us of your arrival.  Please call [phone] if you have any problems the morning of surgery.  Remember: Do not eat food or drink liquids, including water, after midnight: Clear liquids are ok until:   You may brush your teeth the morning of surgery.  Take these meds the morning of surgery with a sip of water:  DO NOT wear jewelry, eye make-up, lipstick,body lotion, or dark fingernail polish.  (Polished toes are ok) You may wear deodorant.  If you are to be admitted after surgery, leave suitcase in car until your room has been assigned. Patients discharged on the day of surgery will not be allowed to drive home. Wear loose fitting, comfortable clothes for your ride home.

## 2013-11-04 NOTE — H&P (Addendum)
Julia Burton is a 33 y.o. female presenting for repeat c-section. History OB History   Grav Para Term Preterm Abortions TAB SAB Ect Mult Living   2 1 1       1      Past Medical History  Diagnosis Date  . Anemia   . History of kidney stones   . Medical history non-contributory   . Anxiety    Past Surgical History  Procedure Laterality Date  . Knee surgery    . Cesarean section      FTP  . Wisdom tooth extraction     Family History: family history is not on file. Social History:  reports that she has never smoked. She has never used smokeless tobacco. She reports that she does not drink alcohol or use illicit drugs.   Prenatal Transfer Tool  Maternal Diabetes: No Genetic Screening: Declined Maternal Ultrasounds/Referrals: Normal Fetal Ultrasounds or other Referrals:  None Maternal Substance Abuse:  No Significant Maternal Medications:  None Significant Maternal Lab Results:  None Other Comments:  None  ROS    Last menstrual period 02/01/2013. Exam Physical Exam  Prenatal labs: ABO, Rh: --/--/O NEG (09/14 0930) Antibody: NEG (09/14 0930) Rubella: Immune (02/09 0000) RPR: Nonreactive (02/09 0000)  HBsAg: Negative (02/09 0000)  HIV: Non-reactive (02/09 0000)  GBS:     Assessment/Plan: Previous c-section, desires repeat R/b/a discussed, questions answered, informed consent   Fordyce Lepak 11/04/2013, 1:18 PM

## 2013-11-06 ENCOUNTER — Inpatient Hospital Stay (HOSPITAL_COMMUNITY)
Admission: RE | Admit: 2013-11-06 | Discharge: 2013-11-08 | DRG: 766 | Disposition: A | Discharge status: Home / Self Care | Payer: BC Managed Care – PPO | Source: Ambulatory Visit | Attending: Obstetrics and Gynecology | Admitting: Obstetrics and Gynecology

## 2013-11-06 ENCOUNTER — Inpatient Hospital Stay (HOSPITAL_COMMUNITY)
Admission: RE | Admit: 2013-11-06 | Payer: BC Managed Care – PPO | Source: Ambulatory Visit | Admitting: Obstetrics and Gynecology

## 2013-11-06 ENCOUNTER — Inpatient Hospital Stay (HOSPITAL_COMMUNITY): Payer: BC Managed Care – PPO | Admitting: Anesthesiology

## 2013-11-06 ENCOUNTER — Encounter (HOSPITAL_COMMUNITY): Payer: BC Managed Care – PPO | Admitting: Anesthesiology

## 2013-11-06 ENCOUNTER — Encounter (HOSPITAL_COMMUNITY)
Admission: RE | Disposition: A | Discharge status: Home / Self Care | Payer: Self-pay | Source: Ambulatory Visit | Attending: Obstetrics and Gynecology

## 2013-11-06 ENCOUNTER — Encounter (HOSPITAL_COMMUNITY): Payer: Self-pay | Admitting: Anesthesiology

## 2013-11-06 DIAGNOSIS — O9902 Anemia complicating childbirth: Secondary | ICD-10-CM | POA: Diagnosis present

## 2013-11-06 DIAGNOSIS — Z87442 Personal history of urinary calculi: Secondary | ICD-10-CM

## 2013-11-06 DIAGNOSIS — D649 Anemia, unspecified: Secondary | ICD-10-CM | POA: Diagnosis present

## 2013-11-06 DIAGNOSIS — O34219 Maternal care for unspecified type scar from previous cesarean delivery: Principal | ICD-10-CM | POA: Diagnosis present

## 2013-11-06 DIAGNOSIS — F411 Generalized anxiety disorder: Secondary | ICD-10-CM | POA: Diagnosis present

## 2013-11-06 DIAGNOSIS — Z98891 History of uterine scar from previous surgery: Secondary | ICD-10-CM

## 2013-11-06 DIAGNOSIS — O99344 Other mental disorders complicating childbirth: Secondary | ICD-10-CM | POA: Diagnosis present

## 2013-11-06 SURGERY — Surgical Case
Anesthesia: Spinal | Site: Abdomen

## 2013-11-06 SURGERY — Surgical Case
Anesthesia: Regional

## 2013-11-06 MED ORDER — WITCH HAZEL-GLYCERIN EX PADS: 1.0000 "application " | MEDICATED_PAD | CUTANEOUS | Status: DC | PRN

## 2013-11-06 MED ORDER — CEFAZOLIN SODIUM-DEXTROSE 2-3 GM-% IV SOLR
2.0000 g | INTRAVENOUS | Status: DC
Start: 2013-11-06 — End: 2013-11-06

## 2013-11-06 MED ORDER — LACTATED RINGERS IV SOLN
INTRAVENOUS | Status: DC | PRN
  Administered 2013-11-06 (×2): via INTRAVENOUS

## 2013-11-06 MED ORDER — KETOROLAC TROMETHAMINE 30 MG/ML IJ SOLN
30.0000 mg | Freq: Four times a day (QID) | INTRAMUSCULAR | Status: AC | PRN
  Administered 2013-11-06: 30 mg via INTRAVENOUS

## 2013-11-06 MED ORDER — FENTANYL CITRATE 0.05 MG/ML IJ SOLN
INTRAMUSCULAR | Status: DC | PRN
  Administered 2013-11-06: 25 ug via INTRATHECAL

## 2013-11-06 MED ORDER — NALOXONE HCL 0.4 MG/ML IJ SOLN: 0.4000 mg | INTRAMUSCULAR | Status: DC | PRN

## 2013-11-06 MED ORDER — DIPHENHYDRAMINE HCL 25 MG PO CAPS: 25.0000 mg | ORAL_CAPSULE | Freq: Four times a day (QID) | ORAL | Status: DC | PRN

## 2013-11-06 MED ORDER — MEDROXYPROGESTERONE ACETATE 150 MG/ML IM SUSP
150.0000 mg | INTRAMUSCULAR | Status: DC | PRN
Start: 2013-11-06 — End: 2013-11-08

## 2013-11-06 MED ORDER — METOCLOPRAMIDE HCL 5 MG/ML IJ SOLN: 10.0000 mg | Freq: Once | INTRAMUSCULAR | Status: DC | PRN

## 2013-11-06 MED ORDER — PRENATAL MULTIVITAMIN CH
1.0000 | ORAL_TABLET | Freq: Every day | ORAL | Status: DC
  Administered 2013-11-07: 1 via ORAL
  Filled 2013-11-06: qty 1

## 2013-11-06 MED ORDER — ONDANSETRON HCL 4 MG/2ML IJ SOLN: 4.0000 mg | Freq: Three times a day (TID) | INTRAMUSCULAR | Status: DC | PRN

## 2013-11-06 MED ORDER — SCOPOLAMINE 1 MG/3DAYS TD PT72
MEDICATED_PATCH | TRANSDERMAL | Status: AC
  Administered 2013-11-06: 1.5 mg via TRANSDERMAL
  Filled 2013-11-06: qty 1

## 2013-11-06 MED ORDER — ONDANSETRON HCL 4 MG/2ML IJ SOLN
INTRAMUSCULAR | Status: DC | PRN
  Administered 2013-11-06: 4 mg via INTRAVENOUS

## 2013-11-06 MED ORDER — NALBUPHINE HCL 10 MG/ML IJ SOLN: 5.0000 mg | INTRAMUSCULAR | Status: DC | PRN

## 2013-11-06 MED ORDER — OXYTOCIN 10 UNIT/ML IJ SOLN
40.0000 [IU] | INTRAVENOUS | Status: DC | PRN
  Administered 2013-11-06: 40 [IU] via INTRAVENOUS

## 2013-11-06 MED ORDER — SODIUM CHLORIDE 0.9 % IJ SOLN: 3.0000 mL | INTRAMUSCULAR | Status: DC | PRN

## 2013-11-06 MED ORDER — LACTATED RINGERS IV SOLN
Freq: Once | INTRAVENOUS | Status: AC
  Administered 2013-11-06: 12:00:00 via INTRAVENOUS

## 2013-11-06 MED ORDER — MEASLES, MUMPS & RUBELLA VAC ~~LOC~~ INJ: 0.5000 mL | INJECTION | Freq: Once | SUBCUTANEOUS | Status: DC

## 2013-11-06 MED ORDER — ONDANSETRON HCL 4 MG PO TABS: 4.0000 mg | ORAL_TABLET | ORAL | Status: DC | PRN

## 2013-11-06 MED ORDER — SIMETHICONE 80 MG PO CHEW: 80.0000 mg | CHEWABLE_TABLET | ORAL | Status: DC | PRN

## 2013-11-06 MED ORDER — DIPHENHYDRAMINE HCL 25 MG PO CAPS: 25.0000 mg | ORAL_CAPSULE | ORAL | Status: DC | PRN

## 2013-11-06 MED ORDER — DEXTROSE IN LACTATED RINGERS 5 % IV SOLN
INTRAVENOUS | Status: DC
  Administered 2013-11-06 – 2013-11-07 (×2): via INTRAVENOUS

## 2013-11-06 MED ORDER — INFLUENZA VAC SPLIT QUAD 0.5 ML IM SUSY
0.5000 mL | PREFILLED_SYRINGE | INTRAMUSCULAR | Status: AC
  Administered 2013-11-08: 0.5 mL via INTRAMUSCULAR
  Filled 2013-11-06: qty 0.5

## 2013-11-06 MED ORDER — SIMETHICONE 80 MG PO CHEW
80.0000 mg | CHEWABLE_TABLET | ORAL | Status: DC
  Administered 2013-11-08: 80 mg via ORAL
  Filled 2013-11-06 (×2): qty 1

## 2013-11-06 MED ORDER — OXYTOCIN 40 UNITS IN LACTATED RINGERS INFUSION - SIMPLE MED
62.5000 mL/h | INTRAVENOUS | Status: AC
Start: 2013-11-06 — End: 2013-11-07

## 2013-11-06 MED ORDER — OXYCODONE-ACETAMINOPHEN 5-325 MG PO TABS
1.0000 | ORAL_TABLET | ORAL | Status: DC | PRN
  Administered 2013-11-07 (×2): 1 via ORAL
  Filled 2013-11-06: qty 1

## 2013-11-06 MED ORDER — MORPHINE SULFATE (PF) 0.5 MG/ML IJ SOLN
INTRAMUSCULAR | Status: DC | PRN
  Administered 2013-11-06: .15 mg via INTRATHECAL

## 2013-11-06 MED ORDER — PHENYLEPHRINE 8 MG IN D5W 100 ML (0.08MG/ML) PREMIX OPTIME
INJECTION | INTRAVENOUS | Status: AC
  Filled 2013-11-06: qty 100

## 2013-11-06 MED ORDER — FENTANYL CITRATE 0.05 MG/ML IJ SOLN: 25.0000 ug | INTRAMUSCULAR | Status: DC | PRN

## 2013-11-06 MED ORDER — OXYCODONE-ACETAMINOPHEN 5-325 MG PO TABS: 2.0000 | ORAL_TABLET | ORAL | Status: DC | PRN

## 2013-11-06 MED ORDER — MEPERIDINE HCL 25 MG/ML IJ SOLN: 6.2500 mg | INTRAMUSCULAR | Status: DC | PRN

## 2013-11-06 MED ORDER — FENTANYL CITRATE 0.05 MG/ML IJ SOLN
INTRAMUSCULAR | Status: AC
  Filled 2013-11-06: qty 2

## 2013-11-06 MED ORDER — MENTHOL 3 MG MT LOZG: 1.0000 | LOZENGE | OROMUCOSAL | Status: DC | PRN

## 2013-11-06 MED ORDER — IBUPROFEN 600 MG PO TABS
600.0000 mg | ORAL_TABLET | Freq: Four times a day (QID) | ORAL | Status: DC
  Administered 2013-11-06 – 2013-11-08 (×6): 600 mg via ORAL
  Filled 2013-11-06 (×6): qty 1

## 2013-11-06 MED ORDER — PHENYLEPHRINE 8 MG IN D5W 100 ML (0.08MG/ML) PREMIX OPTIME
INJECTION | INTRAVENOUS | Status: DC | PRN
  Administered 2013-11-06: 60 ug/min via INTRAVENOUS

## 2013-11-06 MED ORDER — MORPHINE SULFATE 0.5 MG/ML IJ SOLN
INTRAMUSCULAR | Status: AC
  Filled 2013-11-06: qty 10

## 2013-11-06 MED ORDER — SCOPOLAMINE 1 MG/3DAYS TD PT72
1.0000 | MEDICATED_PATCH | Freq: Once | TRANSDERMAL | Status: DC
  Administered 2013-11-06: 1.5 mg via TRANSDERMAL

## 2013-11-06 MED ORDER — ONDANSETRON HCL 4 MG/2ML IJ SOLN: 4.0000 mg | INTRAMUSCULAR | Status: DC | PRN

## 2013-11-06 MED ORDER — LANOLIN HYDROUS EX OINT: 1.0000 "application " | TOPICAL_OINTMENT | CUTANEOUS | Status: DC | PRN

## 2013-11-06 MED ORDER — SIMETHICONE 80 MG PO CHEW
80.0000 mg | CHEWABLE_TABLET | Freq: Three times a day (TID) | ORAL | Status: DC
  Administered 2013-11-07 (×2): 80 mg via ORAL
  Filled 2013-11-06 (×3): qty 1

## 2013-11-06 MED ORDER — LACTATED RINGERS IV SOLN
INTRAVENOUS | Status: DC | PRN
  Administered 2013-11-06: 13:00:00 via INTRAVENOUS

## 2013-11-06 MED ORDER — SENNOSIDES-DOCUSATE SODIUM 8.6-50 MG PO TABS
2.0000 | ORAL_TABLET | ORAL | Status: DC
  Administered 2013-11-08: 2 via ORAL
  Filled 2013-11-06 (×2): qty 2

## 2013-11-06 MED ORDER — CEFAZOLIN SODIUM-DEXTROSE 2-3 GM-% IV SOLR
INTRAVENOUS | Status: AC
  Administered 2013-11-06: 2 g via INTRAVENOUS
  Filled 2013-11-06: qty 50

## 2013-11-06 MED ORDER — DIPHENHYDRAMINE HCL 50 MG/ML IJ SOLN: 12.5000 mg | INTRAMUSCULAR | Status: DC | PRN

## 2013-11-06 MED ORDER — BUPIVACAINE IN DEXTROSE 0.75-8.25 % IT SOLN
INTRATHECAL | Status: DC | PRN
  Administered 2013-11-06: 13 mg via INTRATHECAL

## 2013-11-06 MED ORDER — NALOXONE HCL 1 MG/ML IJ SOLN: 1.0000 ug/kg/h | INTRAVENOUS | Status: DC | PRN

## 2013-11-06 MED ORDER — KETOROLAC TROMETHAMINE 30 MG/ML IJ SOLN
INTRAMUSCULAR | Status: AC
  Administered 2013-11-06: 30 mg via INTRAVENOUS
  Filled 2013-11-06: qty 1

## 2013-11-06 MED ORDER — DIBUCAINE 1 % RE OINT: 1.0000 "application " | TOPICAL_OINTMENT | RECTAL | Status: DC | PRN

## 2013-11-06 MED ORDER — DIPHENHYDRAMINE HCL 50 MG/ML IJ SOLN: 25.0000 mg | INTRAMUSCULAR | Status: DC | PRN

## 2013-11-06 MED ORDER — KETOROLAC TROMETHAMINE 30 MG/ML IJ SOLN: 30.0000 mg | Freq: Four times a day (QID) | INTRAMUSCULAR | Status: AC | PRN

## 2013-11-06 MED ORDER — METOCLOPRAMIDE HCL 5 MG/ML IJ SOLN: 10.0000 mg | Freq: Three times a day (TID) | INTRAMUSCULAR | Status: DC | PRN

## 2013-11-06 MED ORDER — TETANUS-DIPHTH-ACELL PERTUSSIS 5-2.5-18.5 LF-MCG/0.5 IM SUSP: 0.5000 mL | Freq: Once | INTRAMUSCULAR | Status: DC

## 2013-11-06 MED ORDER — ONDANSETRON HCL 4 MG/2ML IJ SOLN
INTRAMUSCULAR | Status: AC
  Filled 2013-11-06: qty 2

## 2013-11-06 MED ORDER — OXYTOCIN 10 UNIT/ML IJ SOLN
INTRAMUSCULAR | Status: AC
  Filled 2013-11-06: qty 4

## 2013-11-06 SURGICAL SUPPLY — 34 items
ADH SKN CLS APL DERMABOND .7 (GAUZE/BANDAGES/DRESSINGS) ×1
BLADE SURG 10 STRL SS (BLADE) ×6 IMPLANT
CLAMP CORD UMBIL (MISCELLANEOUS) IMPLANT
CLOTH BEACON ORANGE TIMEOUT ST (SAFETY) ×3 IMPLANT
DERMABOND ADVANCED (GAUZE/BANDAGES/DRESSINGS) ×2
DERMABOND ADVANCED .7 DNX12 (GAUZE/BANDAGES/DRESSINGS) ×1 IMPLANT
DRAPE LG THREE QUARTER DISP (DRAPES) ×2 IMPLANT
DRSG OPSITE POSTOP 4X10 (GAUZE/BANDAGES/DRESSINGS) ×3 IMPLANT
DURAPREP 26ML APPLICATOR (WOUND CARE) ×3 IMPLANT
ELECT REM PT RETURN 9FT ADLT (ELECTROSURGICAL) ×3
ELECTRODE REM PT RTRN 9FT ADLT (ELECTROSURGICAL) ×1 IMPLANT
EXTRACTOR VACUUM M CUP 4 TUBE (SUCTIONS) IMPLANT
EXTRACTOR VACUUM M CUP 4' TUBE (SUCTIONS)
GLOVE BIO SURGEON STRL SZ 6.5 (GLOVE) ×2 IMPLANT
GLOVE BIO SURGEONS STRL SZ 6.5 (GLOVE) ×1
GLOVE BIOGEL PI IND STRL 7.0 (GLOVE) ×1 IMPLANT
GLOVE BIOGEL PI INDICATOR 7.0 (GLOVE) ×2
GOWN STRL REUS W/TWL LRG LVL3 (GOWN DISPOSABLE) ×6 IMPLANT
KIT ABG SYR 3ML LUER SLIP (SYRINGE) IMPLANT
NDL HYPO 25X5/8 SAFETYGLIDE (NEEDLE) IMPLANT
NEEDLE HYPO 25X5/8 SAFETYGLIDE (NEEDLE) IMPLANT
NS IRRIG 1000ML POUR BTL (IV SOLUTION) ×3 IMPLANT
PACK C SECTION WH (CUSTOM PROCEDURE TRAY) ×3 IMPLANT
PAD OB MATERNITY 4.3X12.25 (PERSONAL CARE ITEMS) ×3 IMPLANT
STAPLER VISISTAT 35W (STAPLE) IMPLANT
SUT CHROMIC 0 CT 802H (SUTURE) IMPLANT
SUT CHROMIC 0 CTX 36 (SUTURE) ×9 IMPLANT
SUT MON AB-0 CT1 36 (SUTURE) ×3 IMPLANT
SUT PDS AB 0 CTX 60 (SUTURE) ×3 IMPLANT
SUT PLAIN 0 NONE (SUTURE) IMPLANT
SUT VIC AB 4-0 KS 27 (SUTURE) IMPLANT
TOWEL OR 17X24 6PK STRL BLUE (TOWEL DISPOSABLE) ×3 IMPLANT
TRAY FOLEY CATH 14FR (SET/KITS/TRAYS/PACK) ×2 IMPLANT
WATER STERILE IRR 1000ML POUR (IV SOLUTION) ×3 IMPLANT

## 2013-11-06 NOTE — Transfer of Care (Signed)
Immediate Anesthesia Transfer of Care Note  Patient: Julia Burton  Procedure(s) Performed: Procedure(s): CESAREAN SECTION (N/A)  Patient Location: PACU  Anesthesia Type:Spinal  Level of Consciousness: awake, alert , oriented and patient cooperative  Airway & Oxygen Therapy: Patient Spontanous Breathing  Post-op Assessment: Report given to PACU RN and Post -op Vital signs reviewed and stable  Post vital signs: Reviewed and stable  Complications: No apparent anesthesia complications

## 2013-11-06 NOTE — Op Note (Signed)
Cesarean Section Procedure Note   Julia Burton  11/06/2013  Indications: Scheduled Proceedure/Maternal Request   Pre-operative Diagnosis: Previous X 1,   edc 10/08/13.   Post-operative Diagnosis: same, dense adhesions of uterus to anterior abdominal wall   Surgeon: Surgeon(s) and Role:    * Marylynn Pearson, MD - Primary   Assistants: none  Anesthesia: spinal   Procedure Details:  The patient was seen in the Holding Room. The risks, benefits, complications, treatment options, and expected outcomes were discussed with the patient. The patient concurred with the proposed plan, giving informed consent. identified as [name] and the procedure verified as C-Section Delivery. A Time Out was held and the above information confirmed.  After induction of anesthesia, the patient was draped and prepped in the usual sterile manner. A transverse was made and carried down through the subcutaneous tissue to the fascia. Fascial incision was made and extended transversely. The fascia was separated from the underlying rectus tissue superiorly and inferiorly. The peritoneum was identified and entered. Peritoneal incision was extended longitudinally. The utero-vesical peritoneal reflection was incised transversely and the bladder flap was bluntly freed from the lower uterine segment. A low transverse uterine incision was made. Delivered from cephalic presentation was a vigorous female with Apgar scores of 9 at one minute and 9 at five minutes. Cord ph was not sent the umbilical cord was clamped and cut cord blood was obtained for evaluation. The placenta was removed Intact and appeared normal. The uterine outline, tubes and ovaries appeared normal}. The uterine incision was closed with running locked sutures of 0chromic gut.   Hemostasis was observed. Lavage was carried out until clear.  Peritoneum was closed with 0 monocryl.  The fascia was then reapproximated with running sutures of 0PDS.  The skin was  closed with 4-0Vicryl.   Instrument, sponge, and needle counts were correct prior the abdominal closure and were correct at the conclusion of the case.    Findings:vigorous female, dense adhesions of uterus to anterior abdominal wall   Estimated Blood Loss:  800cc   Urine Output: clear  Specimens  Complications: no complications  Disposition: PACU - hemodynamically stable.   Maternal Condition: stable   Baby condition / location:  Couplet care / Skin to Skin  Attending Attestation: I was present and scrubbed for the entire procedure.   Signed: Surgeon(s): Marylynn Pearson, MD

## 2013-11-06 NOTE — Lactation Note (Signed)
This note was copied from the chart of Julia Salina Stanfield. Lactation Consultation Note Initial visit at 9 hours of age. Mom reports baby tries to latch and will suck a few times and stop.  He is reported to be a little gaggy and has spit up some mucus.  Explained to mom baby is probably not feeling ready to eat yet.  [facility] LC resources given and discussed.  Encouraged to feed with early cues on demand.  Early newborn behavior discussed.  Hand expression demonstrated with colostrum easily expressed with return demonstration. Mom to call for assist as needed.    Patient Name: Julia Burton [id number]'H Date: 11/06/2013 Reason for consult: Initial assessment   Maternal Data Has patient been taught Hand Expression?: Yes Does the patient have breastfeeding experience prior to this delivery?: Yes  Feeding Feeding Type: Breast Fed Length of feed: 1 min  LATCH Score/Interventions                Intervention(s): Breastfeeding basics reviewed     Lactation Tools Discussed/Used     Consult Status Consult Status: Follow-up Date: 11/07/13 Follow-up type: In-patient    Justice Britain 11/06/2013, 10:27 PM

## 2013-11-06 NOTE — Anesthesia Procedure Notes (Signed)
Spinal  Patient location during procedure: OR Start time: 11/06/2013 1:04 PM Staffing Anesthesiologist: Tandrea Kommer A. Performed by: anesthesiologist  Preanesthetic Checklist Completed: patient identified, site marked, surgical consent, pre-op evaluation, timeout performed, IV checked, risks and benefits discussed and monitors and equipment checked Spinal Block Patient position: sitting Prep: site prepped and draped and DuraPrep Patient monitoring: heart rate, cardiac monitor, continuous pulse ox and blood pressure Approach: midline Location: L3-4 Injection technique: single-shot Needle Needle type: Sprotte  Needle gauge: 24 G Needle length: 9 cm Needle insertion depth: 6 cm Assessment Sensory level: T4 Additional Notes Patient tolerated procedure well. Adequate sensory level.

## 2013-11-06 NOTE — Anesthesia Preprocedure Evaluation (Signed)
Anesthesia Evaluation  Patient identified by MRN, date of birth, ID band Patient awake    Reviewed: Allergy & Precautions, H&P , NPO status , Patient's Chart, lab work & pertinent test results  Airway Mallampati: III      Dental  (+) Teeth Intact   Pulmonary neg pulmonary ROS,  breath sounds clear to auscultation  Pulmonary exam normal       Cardiovascular negative cardio ROS  Rhythm:Regular Rate:Normal     Neuro/Psych Anxiety negative neurological ROS     GI/Hepatic Neg liver ROS, GERD-  Medicated and Controlled,  Endo/Other  Obesity  Renal/GU Renal Calculi  negative genitourinary   Musculoskeletal   Abdominal (+) + obese,   Peds  Hematology  (+) anemia ,   Anesthesia Other Findings   Reproductive/Obstetrics (+) Pregnancy Previous C/Section                           Anesthesia Physical Anesthesia Plan  ASA: II  Anesthesia Plan: Spinal   Post-op Pain Management:    Induction:   Airway Management Planned: Natural Airway  Additional Equipment:   Intra-op Plan:   Post-operative Plan:   Informed Consent: I have reviewed the patients History and Physical, chart, labs and discussed the procedure including the risks, benefits and alternatives for the proposed anesthesia with the patient or authorized representative who has indicated his/her understanding and acceptance.   Dental advisory given  Plan Discussed with: CRNA and Surgeon  Anesthesia Plan Comments:         Anesthesia Quick Evaluation

## 2013-11-06 NOTE — Anesthesia Postprocedure Evaluation (Signed)
  Anesthesia Post-op Note  Patient: Julia Burton  Procedure(s) Performed: Procedure(s): CESAREAN SECTION (N/A)  Patient Location: PACU  Anesthesia Type:General  Level of Consciousness: awake, alert  and oriented  Airway and Oxygen Therapy: Patient Spontanous Breathing  Post-op Pain: none  Post-op Assessment: Post-op Vital signs reviewed, Patient's Cardiovascular Status Stable, Respiratory Function Stable, Patent Airway, No signs of Nausea or vomiting, Pain level controlled, No headache and No backache  Post-op Vital Signs: Reviewed and stable  Last Vitals:  Filed Vitals:   11/06/13 1430  BP: 110/65  Pulse: 80  Temp:   Resp: 17    Complications: No apparent anesthesia complications

## 2013-11-07 ENCOUNTER — Encounter (HOSPITAL_COMMUNITY): Payer: Self-pay | Admitting: Obstetrics and Gynecology

## 2013-11-07 LAB — CBC
HEMATOCRIT: 32.9 % — AB (ref 36.0–46.0)
Hemoglobin: 11 g/dL — ABNORMAL LOW (ref 12.0–15.0)
MCH: 31 pg (ref 26.0–34.0)
MCHC: 33.4 g/dL (ref 30.0–36.0)
MCV: 92.7 fL (ref 78.0–100.0)
PLATELETS: 185 10*3/uL (ref 150–400)
RBC: 3.55 MIL/uL — ABNORMAL LOW (ref 3.87–5.11)
RDW: 13.7 % (ref 11.5–15.5)
WBC: 13.9 10*3/uL — ABNORMAL HIGH (ref 4.0–10.5)

## 2013-11-07 LAB — BIRTH TISSUE RECOVERY COLLECTION (PLACENTA DONATION)

## 2013-11-07 MED ORDER — RHO D IMMUNE GLOBULIN 1500 UNIT/2ML IJ SOSY
300.0000 ug | PREFILLED_SYRINGE | Freq: Once | INTRAMUSCULAR | Status: AC
  Administered 2013-11-07: 300 ug via INTRAMUSCULAR
  Filled 2013-11-07: qty 2

## 2013-11-07 NOTE — Progress Notes (Signed)
Clinical Social Work Department PSYCHOSOCIAL ASSESSMENT - MATERNAL/CHILD 11/07/2013  Patient:  Julia Burton, Julia Burton  Account Number:  [id number]  Admit Date:  11/06/2013  Ardine Eng Name:   Sarina Ser   Clinical Social Worker:  Lucita Ferrara, CLINICAL SOCIAL WORKER   Date/Time:  11/07/2013 11:00 AM  Date Referred:  11/06/2013   Referral source  Central Nursery     Referred reason  Depression/Anxiety   Other referral source:    I:  FAMILY / HOME ENVIRONMENT Child's legal guardian:  PARENT  Guardian - Name [place] - Age [place], [place] [address]  Campbell Lerner  same as above   Other household support members/support persons Name Relationship DOB  Thornton Papas 33 years old   Other support:   MOB identified her parents and the FOB's as supportive and actively involved in their lives.  She denied any barriers to receiving their help/support.    II  PSYCHOSOCIAL DATA Information Source:  Family Interview  Financial and [employer] Employment:   MOB stated that she was working at [employer] but is unsure if she will be returning to work.  She is able to return if she wants, but is hoping that she will be able to stay at home.  MOB shared that she feels supported by her employer.   Financial resources:  Multimedia programmer If [place]:    School / Grade:  N/A Music therapist / Child Services Coordination / Early Interventions:   N/A  Cultural issues impacting care:   None reported    III  STRENGTHS Strengths  Adequate Resources  Home prepared for Child (including basic supplies)  Supportive family/friends   Strength comment:    IV  RISK FACTORS AND CURRENT PROBLEMS Current Problem:  YES   Risk Factor & Current Problem Patient Issue Family Issue Risk Factor / Current Problem Comment  Mental Illness Y N MOB presents with mental health history significant for anxiety.  She is currently not receiving  medications but shared belief that symptoms are stable.  Family confirmed that symptoms have stablized.    V  SOCIAL WORK ASSESSMENT CSW met with the MOB in her room in order to complete the assessment. Consult was ordered due to MOB presenting with a history of anxiety.  MOB provided consent for the assessment to be completed with the [facility] and FOB present.  MOB was easily engaged, displayed full range in affect, and presented in a pleasant mood.  MOB did not present with any acute symptoms of anxiety and presents with self-awareness about her mental health needs.   MOB expressed excitement as she transitions into the postpartum period. She denied any questions or concerns, and shared perceptions that she feels well supported by her family and her employer.  MOB denied any acute psychosocial stressors or any recent life changes.  She stated that her 54 year old son is excited to become a big brother, and the entire family is looking forward to the new baby.    MOB endorsed history of anxiety since early adulthood.  She reported that she had been prescribed medication, but due to stabilization of symptoms, medications were discontinued prior to her pregnancy.  She denied any difficulties with her anxiety during her pregnancy, and her family confirmed this information.  MOB presented with self-awareness about her triggers for her anxiety, and was receptive to discussing with CSW how her anxiety increases when she feels overwhelmed and experiences intense  emotions (following the death of her father, etc).  MOB also presented with awareness of somatic feelings that her anxiety is worsening, and she stated that she is able to count to ten and take deep breaths to self-regulate prior to experiencing a panic attack.  MOB endorsed supportive relationship with her MD and that she has no difficulties asking the MD for a new prescription for medications if needed as she transitions into the postpartum period. MOB denied  history of postpartum depression, but was receptive to education from [place].   No barriers to discharge.     VI SOCIAL WORK PLAN Social Work Therapist, art  No Further Intervention Required / No Barriers to Discharge   Type of pt/family education:   Postpartum depression   If child protective services report - county:   If child protective services report - date:   Information/referral to community resources comment:   Other social work plan:   CSW to provide ongoing emotional support PRN.

## 2013-11-07 NOTE — Addendum Note (Signed)
Addendum created 11/07/13 0803 by Alvy Bimler, CRNA   Modules edited: Notes Section   Notes Section:  File: [id number]

## 2013-11-07 NOTE — Anesthesia Postprocedure Evaluation (Signed)
  Anesthesia Post-op Note  Patient: Julia Burton  Procedure(s) Performed: Procedure(s): CESAREAN SECTION (N/A)  Patient Location: PACU and Mother/Baby  Anesthesia Type:Spinal  Level of Consciousness: awake, alert , oriented and patient cooperative  Airway and Oxygen Therapy: Patient Spontanous Breathing  Post-op Pain: none  Post-op Assessment: Post-op Vital signs reviewed, Patient's Cardiovascular Status Stable and Respiratory Function Stable  Post-op Vital Signs: Reviewed and stable  Last Vitals:  Filed Vitals:   11/07/13 0130  BP: 109/57  Pulse: 81  Temp: 37.2 C  Resp: 16    Complications: No apparent anesthesia complications

## 2013-11-07 NOTE — Progress Notes (Signed)
Subjective: Postpartum Day 1: Cesarean Delivery Patient reports tolerating PO and no problems voiding.    Objective: Vital signs in last 24 hours: Temp:  [97.7 F (36.5 C)-99 F (37.2 C)] 99 F (37.2 C) (09/17 0130) Pulse Rate:  [59-102] 81 (09/17 0130) Resp:  [13-22] 16 (09/17 0130) BP: (104-121)/(57-82) 109/57 mmHg (09/17 0130) SpO2:  [97 %-100 %] 98 % (09/17 0130) Weight:  [220 lb (99.791 kg)] 220 lb (99.791 kg) (09/16 1700)  Physical Exam:  General: alert and cooperative Lochia: appropriate Uterine Fundus: firm Incision: honeycomb dressing CDI DVT Evaluation: No evidence of DVT seen on physical exam. Negative Homan's sign. No cords or calf tenderness. No significant calf/ankle edema.   Recent Labs  11/04/13 0930 11/07/13 0535  HGB 11.8* 11.0*  HCT 35.5* 32.9*    Assessment/Plan: Status post Cesarean section. Doing well postoperatively.  Desires circ prior to discharge.  Suliman Termini G 11/07/2013, 7:59 AM

## 2013-11-07 NOTE — Lactation Note (Signed)
This note was copied from the chart of Julia Burton. Lactation Consultation Note  Patient Name: Julia Burton [id number] Date: 11/07/2013 Reason for consult: Follow-up assessment of this mom and baby, now 31 hours postpartum.  Mom recently breastfed baby for 35 minutes and RN assessed LATCH score=8.  Mom is exclusively breastfeeding and baby having minimum output for this hour of life.  Mom says she is needing a little extra attention to latch to her (R) breast but denies nipple pain.  LC encouraged continued cue feeding and mom to call for assistance as needed.   Maternal Data    Feeding Feeding Type: Breast Fed Length of feed: 35 min  LATCH Score/Interventions Latch: Repeated attempts needed to sustain latch, nipple held in mouth throughout feeding, stimulation needed to elicit sucking reflex. Intervention(s): Adjust position;Breast compression  Audible Swallowing: A few with stimulation  Type of Nipple: Everted at rest and after stimulation  Comfort (Breast/Nipple): Soft / non-tender     Hold (Positioning): No assistance needed to correctly position infant at breast. Intervention(s): Position options;Support Pillows  LATCH Score: 8 (most recent feeding assessment by RN)  Lactation Tools Discussed/Used   Cue feedings  Consult Status Consult Status: Follow-up Date: 11/08/13 Follow-up type: In-patient    Junious Dresser [facility] 11/07/2013, 9:00 PM

## 2013-11-08 LAB — RH IG WORKUP (INCLUDES ABO/RH)
ABO/RH(D): O NEG
Fetal Screen: NEGATIVE
Gestational Age(Wks): 39.5
UNIT DIVISION: 0

## 2013-11-08 MED ORDER — IBUPROFEN 600 MG PO TABS: 600.0000 mg | ORAL_TABLET | Freq: Four times a day (QID) | ORAL | Status: DC

## 2013-11-08 MED ORDER — OXYCODONE-ACETAMINOPHEN 5-325 MG PO TABS: 1.0000 | ORAL_TABLET | ORAL | Status: DC | PRN

## 2013-11-08 NOTE — Discharge Summary (Signed)
Obstetric Discharge Summary Reason for Admission: cesarean section Prenatal Procedures: ultrasound Intrapartum Procedures: cesarean: low cervical, transverse Postpartum Procedures: none Complications-Operative and Postpartum: none Hemoglobin  Date Value Ref Range Status  11/07/2013 11.0* 12.0 - 15.0 g/dL Final     HCT  Date Value Ref Range Status  11/07/2013 32.9* 36.0 - 46.0 % Final    Physical Exam:  General: alert and cooperative Lochia: appropriate Uterine Fundus: firm Incision: healing well DVT Evaluation: No evidence of DVT seen on physical exam. Negative Homan's sign. No cords or calf tenderness.  Discharge Diagnoses: Term Pregnancy-delivered  Discharge Information: Date: 11/08/2013 Activity: pelvic rest Diet: routine Medications: PNV, Ibuprofen and Percocet Condition: stable Instructions: refer to practice specific booklet Discharge to: home   Newborn Data: Live born female  Birth Weight: 9 lb 10.3 oz (4375 g) APGAR: 9, 9  Home with mother.  Cilicia Borden G 11/08/2013, 8:03 AM

## 2013-12-17 ENCOUNTER — Other Ambulatory Visit: Payer: Self-pay | Admitting: Obstetrics and Gynecology

## 2013-12-18 LAB — CYTOLOGY - PAP

## 2013-12-23 ENCOUNTER — Encounter (HOSPITAL_COMMUNITY): Payer: Self-pay | Admitting: Obstetrics and Gynecology

## 2014-09-01 ENCOUNTER — Ambulatory Visit (INDEPENDENT_AMBULATORY_CARE_PROVIDER_SITE_OTHER): Payer: BLUE CROSS/BLUE SHIELD | Admitting: Physician Assistant

## 2014-09-01 ENCOUNTER — Encounter: Payer: Self-pay | Admitting: Physician Assistant

## 2014-09-01 VITALS — BP 125/81 | HR 72 | Temp 98.1°F | Ht 67.5 in | Wt 166.0 lb

## 2014-09-01 DIAGNOSIS — Z0184 Encounter for antibody response examination: Secondary | ICD-10-CM

## 2014-09-02 LAB — VARICELLA ZOSTER ANTIBODY, IGG: Varicella zoster IgG: 516 index (ref >165)

## 2014-09-02 LAB — HEPATITIS B SURFACE ANTIBODY,QUALITATIVE: Hep B Surface Ab, Qual: REACTIVE

## 2014-09-02 LAB — MEASLES/MUMPS/RUBELLA IMMUNITY
MUMPS ABS, IGG: 107 AU/mL (ref >10.9)
RUBELLA: 1.9 {index} (ref >0.99)
RUBEOLA AB, IGG: <25 AU/mL — ABNORMAL LOW (ref >29.9)

## 2014-09-04 ENCOUNTER — Other Ambulatory Visit: Payer: Self-pay | Admitting: Physician Assistant

## 2014-09-04 DIAGNOSIS — R899 Unspecified abnormal finding in specimens from other organs, systems and tissues: Secondary | ICD-10-CM

## 2014-09-05 ENCOUNTER — Ambulatory Visit (INDEPENDENT_AMBULATORY_CARE_PROVIDER_SITE_OTHER): Payer: BLUE CROSS/BLUE SHIELD | Admitting: *Deleted

## 2014-09-05 DIAGNOSIS — Z23 Encounter for immunization: Secondary | ICD-10-CM

## 2014-09-05 NOTE — Patient Instructions (Signed)
Measles/Mumps/Rubella Vaccines, MMR injection What is this medicine? MEASLES VIRUS; MUMPS VIRUS; RUBELLA VIRUS VACCINE LIVE (MEE zuhlz VAHY ruhs; muhmps VAHY ruhs; roo bel uh VAHY ruhs vak SEEN lahyv ) is used to prevent an infection with measles (rubeola), mumps, and rubella (German measles) viruses. It is used to prevent infection in children over 12 months old, adults that have not been vaccinated and are not pregnant, and anyone traveling to countries where there are high rates of measles, mumps, or rubella. This medicine may be used for other purposes; ask your health care provider or pharmacist if you have questions. COMMON BRAND NAME(S): M-M-R II What should I tell my health care provider before I take this medicine? They need to know if you have any of these conditions: -bleeding disorder -cancer including leukemia or lymphoma -immune system problems -infection with fever -low levels of platelets in the blood -recent blood transfusion or immune globulin infusion -seizure disorder -taking medicines for immunosuppression -an unusual or allergic reaction to vaccines, eggs, neomycin, gelatin, other medicines, foods, dyes, or preservatives -pregnant or trying to get pregnant -breast-feeding How should I use this medicine? This vaccine is for injection under the skin. It is given by a health care professional. A copy of Vaccine Information Statements will be given before each vaccination. Read this sheet carefully each time. The sheet may change frequently. Talk to your pediatrician regarding the use of this medicine in children. While this drug may be prescribed for children as young as 12 months of age for selected conditions, precautions do apply. Overdosage: If you think you have taken too much of this medicine contact a poison control center or emergency room at once. NOTE: This medicine is only for you. Do not share this medicine with others. What if I miss a dose? Keep appointments  for follow-up (booster) doses as directed. It is important not to miss your dose. Call your doctor or health care professional if you are unable to keep an appointment. What may interact with this medicine? Do not take this medicine with any of the following medications: -adalimumab -anakinra -etanercept -infliximab -medicines that suppress your immune system -medicines to treat cancer This medicine may also interact with the following medications: -immune globulins -live virus vaccines This list may not describe all possible interactions. Give your health care provider a list of all the medicines, herbs, non-prescription drugs, or dietary supplements you use. Also tell them if you smoke, drink alcohol, or use illegal drugs. Some items may interact with your medicine. What should I watch for while using this medicine? Visit your doctor for check-ups as directed. Do not become pregnant for 3 months after receiving this vaccine. Women should inform their doctor if they wish to become pregnant or think they might be pregnant. There is a potential for serious side effects to an unborn child. Talk to your health care professional or pharmacist for more information. What side effects may I notice from receiving this medicine? Side effects that you should report to your doctor or health care professional as soon as possible: -allergic reactions like skin rash, itching or hives, swelling of the face, lips, or tongue -breathing problems -changes in hearing -changes in vision -difficulty walking -extreme changes in behavior -fast, irregular heartbeat -fever over 100 degrees F -pain, tingling, numbness in the hands or feet -seizures -unusual bleeding or bruising -unusually weak or tired Side effects that usually do not require medical attention (report to your doctor or health care professional if they continue or   are bothersome): -aches or pains -bruising, pain, swelling at site where  injected -diarrhea -headache -low-grade fever of 100 degrees F or less -nausea, vomiting -runny nose, cough -sleepy -swollen glands This list may not describe all possible side effects. Call your doctor for medical advice about side effects. You may report side effects to FDA at 1-800-FDA-1088. Where should I keep my medicine? This drug is given in a hospital or clinic and will not be stored at home. NOTE: This sheet is a summary. It may not cover all possible information. If you have questions about this medicine, talk to your doctor, pharmacist, or health care provider.  2015, Elsevier/Gold Standard. (2013-03-08 11:04:43)  

## 2014-09-05 NOTE — Progress Notes (Signed)
Pt tolerated well

## 2014-10-06 ENCOUNTER — Ambulatory Visit (INDEPENDENT_AMBULATORY_CARE_PROVIDER_SITE_OTHER): Payer: 59 | Admitting: *Deleted

## 2014-10-06 DIAGNOSIS — Z23 Encounter for immunization: Secondary | ICD-10-CM | POA: Diagnosis not present

## 2014-10-06 NOTE — Progress Notes (Signed)
MMR vaccine given and tolerated well.

## 2014-10-06 NOTE — Patient Instructions (Signed)
MMR Vaccine (Measles, Mumps and Rubella): What You Need to Know 1. Why get vaccinated? Measles, mumps, and rubella are serious diseases. Before vaccines they were very common, especially among children. Measles  Measles virus causes rash, cough, runny nose, eye irritation, and fever.  It can lead to ear infection, pneumonia, seizures (jerking and staring), brain damage, and death. Mumps  Mumps virus causes fever, headache, muscle pain, loss of appetite, and swollen glands.  It can lead to deafness, meningitis (infection of the brain and spinal cord covering), painful swelling of the testicles or ovaries, and rarely sterility. Rubella (German Measles)  Rubella virus causes rash, arthritis (mostly in women), and mild fever.  If a woman gets rubella while she is pregnant, she could have a miscarriage or her baby could be born with serious birth defects. These diseases spread from person to person through the air. You can easily catch them by being around someone who is already infected. Measles, mumps, and rubella (MMR) vaccine can protect children (and adults) from all three of these diseases. Thanks to successful vaccination programs these diseases are much less common in the [place] than they used to be. But if we stopped vaccinating they would return.  2. Who should get MMR vaccine and when? Children should get 2 doses of MMR vaccine:  First Dose: 12-15 months of age  Second Dose: 4-6 years of age (may be given earlier, if at least 28 days after the 1st dose) Some infants younger than 12 months should get a dose of MMR if they are traveling out of the country. (This dose will not count toward their routine series.) Some adults should also get MMR vaccine: Generally, anyone 18 years of age or older who was born after 1955 should get at least one dose of MMR vaccine, unless they can show that they have either been vaccinated or had all three diseases. MMR vaccine may be given at the same  time as other vaccines. Children between 1 and 12 years of age can get a "combination" vaccine called MMRV, which contains both MMR and varicella (chickenpox) vaccines. There is a separate Vaccine Information Statement for MMRV. 3. Some people should not get MMR vaccine or should wait.  Anyone who has ever had a life-threatening allergic reaction to the antibiotic neomycin, or any other component of MMR vaccine, should not get the vaccine. Tell your doctor if you have any severe allergies.  Anyone who had a life-threatening allergic reaction to a previous dose of MMR or MMRV vaccine should not get another dose.  Some people who are sick at the time the shot is scheduled may be advised to wait until they recover before getting MMR vaccine.  Pregnant women should not get MMR vaccine. Pregnant women who need the vaccine should wait until after giving birth. Women should avoid getting pregnant for 4 weeks after vaccination with MMR vaccine.  Tell your doctor if the person getting the vaccine:  Has HIV/AIDS, or another disease that affects the immune system  Is being treated with drugs that affect the immune system, such as steroids  Has any kind of cancer  Is being treated for cancer with radiation or drugs  Has ever had a low platelet count (a blood disorder)  Has gotten another vaccine within the past 4 weeks  Has recently had a transfusion or received other blood products Any of these might be a reason to not get the vaccine, or delay vaccination until later. 4. What are the risks   from MMR vaccine? A vaccine, like any medicine, is capable of causing serious problems, such as severe allergic reactions.  The risk of MMR vaccine causing serious harm, or death, is extremely small. Getting MMR vaccine is much safer than getting measles, mumps or rubella. Most people who get MMR vaccine do not have any serious problems with it. Mild problems  Fever (up to 1 person out of 6)  Mild rash  (about 1 person out of 20)  Swelling of glands in the cheeks or neck (about 1 person out of 75) If these problems occur, it is usually within 6-14 days after the shot. They occur less often after the second dose. Moderate problems  Seizure (jerking or staring) caused by fever (about 1 out of 3,000 doses)  Temporary pain and stiffness in the joints, mostly in teenage or adult women (up to 1 out of 4)  Temporary low platelet count, which can cause a bleeding disorder (about 1 out of 30,000 doses) Severe problems (very rare)  Serious allergic reaction (less than 1 out of a million doses)  Several other severe problems have been reported after a child gets MMR vaccine, including:  Deafness  Long-term seizures, coma, or lowered consciousness  Permanent brain damage These are so rare that it is hard to tell whether they are caused by the vaccine.  5. What if there is a serious reaction? What should I look for?  Look for anything that concerns you, such as signs of a severe allergic reaction, very high fever, or behavior changes. Signs of a severe allergic reaction can include hives, swelling of the face and throat, difficulty breathing, a fast heartbeat, dizziness, and weakness. These would start a few minutes to a few hours after the vaccination.  What should I do?  If you think it is a severe allergic reaction or other emergency that can't wait, call 9-1-1 or get the person to the nearest hospital. Otherwise, call your doctor.  Afterward, the reaction should be reported to the Vaccine Adverse Event Reporting System (VAERS). Your doctor might file this report, or you can do it yourself through the VAERS web site at www.vaers.SamedayNews.es, or by calling 818-392-0158. VAERS is only for reporting reactions. They do not give medical advice. 6. The National Vaccine Injury Compensation Program The [employer] Vaccine Injury Compensation Program (VICP) is a federal program that was created to  compensate people who may have been injured by certain vaccines. Persons who believe they may have been injured by a vaccine can learn about the program and about filing a claim by calling 5143565717 or visiting the [place] website at GoldCloset.com.ee. 7. How can I learn more?  Ask your doctor.  Call your local or state health department.  Contact the Centers for Disease Control and Prevention (CDC):  Call 712-680-1718 (1-800-CDC-INFO)  or  Visit CDC's website at http://hunter.com/ CDC Measles, Mumps, and Rubella (MMR) Interim VIS (06/11/10) Document Released: 12/05/2005 Document Revised: 06/24/2013 Document Reviewed: 03/21/2013 ExitCare Patient Information 2015 [place], [place]. This information is not intended to replace advice given to you by your health care provider. Make sure you discuss any questions you have with your health care provider.

## 2014-10-22 NOTE — Progress Notes (Signed)
Patient ID: Julia Burton, female   DOB: [birth year], 34 y.o.   MRN: [id number]   34 y/o female presents for school work physical. She also needs titers for immunization status. She has no complaints today.   ROS:   Negative for diaphoresis, chills, fever, adenopathy, palpitations, SOB, cough, n/v, diarrhea, constipation, numbness, weakness, pain, anxiety, depression   A/P Bilateral TM WNL, normocephalic with no trauma, PERRLA bilateral, nasal turbinates clear, posterior pharynx clear Abdomen soft, nonttp, Cardiac RRR with no murmurs/rub,   1. Immunity status testing  - Varicella zoster antibody, IgG - Measles/Mumps/Rubella Immunity - Hepatitis B surface antibody   Will report results once labs return.   Lynnsie Linders A. Benjamin Stain PA-C

## 2014-12-29 ENCOUNTER — Ambulatory Visit (INDEPENDENT_AMBULATORY_CARE_PROVIDER_SITE_OTHER): Payer: 59 | Admitting: *Deleted

## 2014-12-29 DIAGNOSIS — Z111 Encounter for screening for respiratory tuberculosis: Secondary | ICD-10-CM | POA: Diagnosis not present

## 2014-12-29 NOTE — Patient Instructions (Signed)
Tuberculin Skin Test WHY AM I HAVING THIS TEST? Tuberculosis (TB) is a bacterial infection caused by Mycobacterium tuberculosis. Most people who are exposed to these bacteria have a strong enough defense (immune) system to prevent the bacteria from causing TB and developing symptoms. Their bodies prevent the germs from being active and making them sick (latent TB infection).  However, if you have TB germs in your body and your immune system is weak, you can develop a TB infection. This can cause symptoms such as:   Night sweats.  Fever.  Weakness.  Weight loss. A latent TB infection can also become active later in life if your immune system becomes weakened or compromised. You may have this test if your health care provider suspects that you have TB. You may also have this test to screen for TB if you are at risk for getting the disease. Those at increased risk include:  People who inject illegal drugs or share needles.  People with HIV or other diseases that affect immunity.  Health care workers.  People who live in high-risk communities, such as homeless shelters, nursing homes, and correctional facilities.  People who have been in contact with someone with TB.  People from countries where TB is more common. If you are in a high-risk group, your health care provider may wish to screen for TB more often. This can help prevent the spread of the disease. Sometimes TB screening is required when starting a new job, such as becoming a health care worker or a teacher. Colleges or universities may require it of new students. HOW WILL I BE TESTED? A tuberculin skin test is the main test used to check for exposure to the bacteria that can cause TB. The test checks for antibodies to the bacteria. Antibodies are proteins that your body produces to protect you from germs and other things that can make you sick. Your health care provider will inject a solution known as PPD (purified protein  derivative) under the first layer of skin on your arm. This causes a blister-like bubble to form at the site. Your health care provider will then examine the site after a number of hours have passed to see if a reaction has occurred. HOW DO I PREPARE FOR THE TEST? There is no preparation required for this test. WHAT DO THE RESULTS MEAN? Your test results will be reported as either negative or positive.  If the tuberculin skin test produces a negative result, it is likely that you do not have TB and have not been exposed to the TB bacteria. If you or your health care provider suspects exposure, however, you may want to repeat the test a few weeks later. A blood test may also be used to check for TB. This is because you will not react to the tuberculin skin test until several weeks after exposure to TB bacteria. If you test positive to the tuberculin skin test, it is likely that you have been exposed to TB bacteria. The test does not distinguish between an active and a latent TB infection. A false-positive result can occur. A false-positive result for TB bacteria is incorrect because it indicates a condition or finding is present when it is not. Talk to your health care provider to discuss your results, treatment options, and if necessary, the need for more tests. It is your responsibility to obtain your test results. Ask the lab or department performing the test when and how you will get your results. Talk with your   health care provider if you have any questions about your results.   This information is not intended to replace advice given to you by your health care provider. Make sure you discuss any questions you have with your health care provider.   Document Released: 11/17/2004 Document Revised: 02/28/2014 Document Reviewed: 06/03/2013 Elsevier Interactive Patient Education 2016 Elsevier Inc.  

## 2014-12-29 NOTE — Progress Notes (Signed)
Ppd skin test placed on left forearm and patient tolerated well. Patient will return on Wednesday to have ppd skin test read.

## 2015-02-17 ENCOUNTER — Ambulatory Visit (INDEPENDENT_AMBULATORY_CARE_PROVIDER_SITE_OTHER): Payer: 59

## 2015-02-17 DIAGNOSIS — Z111 Encounter for screening for respiratory tuberculosis: Secondary | ICD-10-CM

## 2015-02-17 LAB — TB SKIN TEST
INDURATION: 0 mm
TB Skin Test: NEGATIVE

## 2016-04-24 IMAGING — US US RENAL
1 series · 14 of 25 positions shown · non-contrast
Comparison: None.

CLINICAL DATA: 32-year-old pregnant female with back pain and
hematuria. History of urinary calculi. Estimated gestational age of
37 weeks.

EXAM:
RENAL/URINARY TRACT ULTRASOUND COMPLETE

[Series 1: us renal · 0.20mm/px · 14 of 27 slices shown]
[im 1/27]
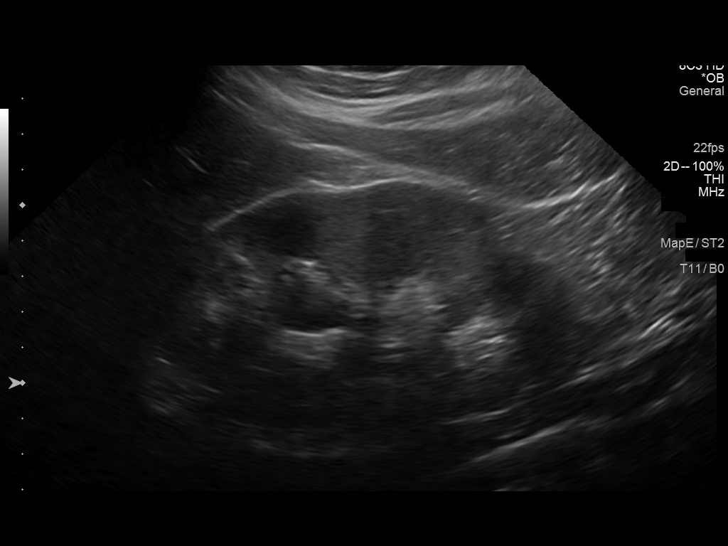
[im 3/27]
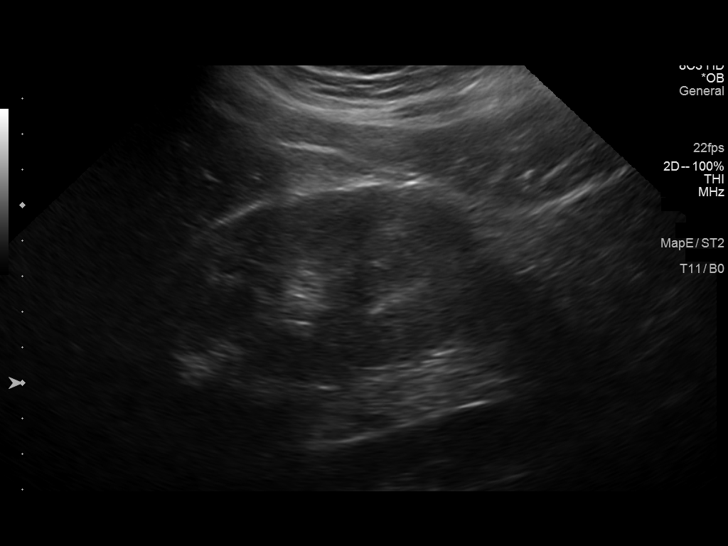
[im 5/27]
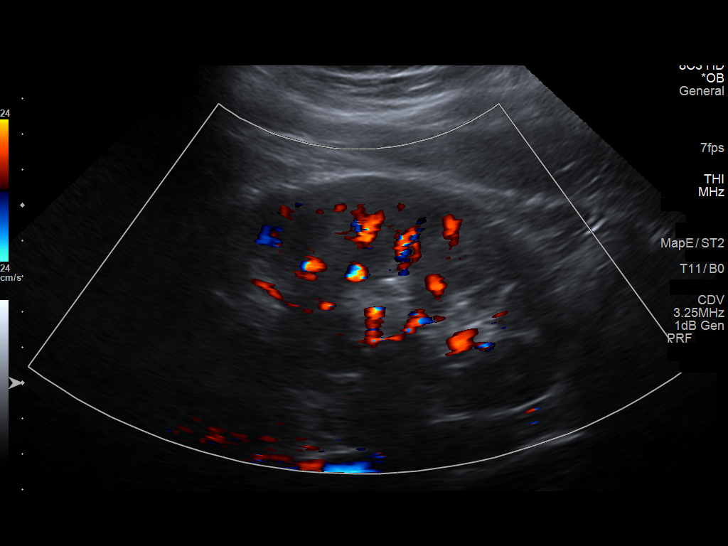
[im 7/27]
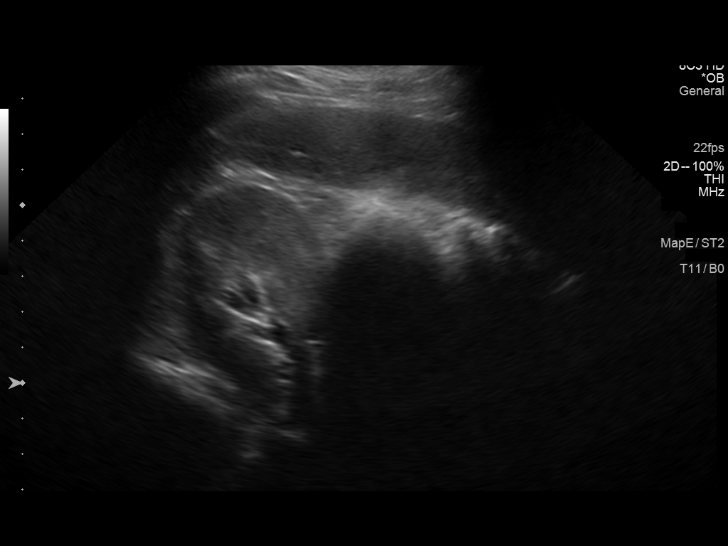
[im 9/27]
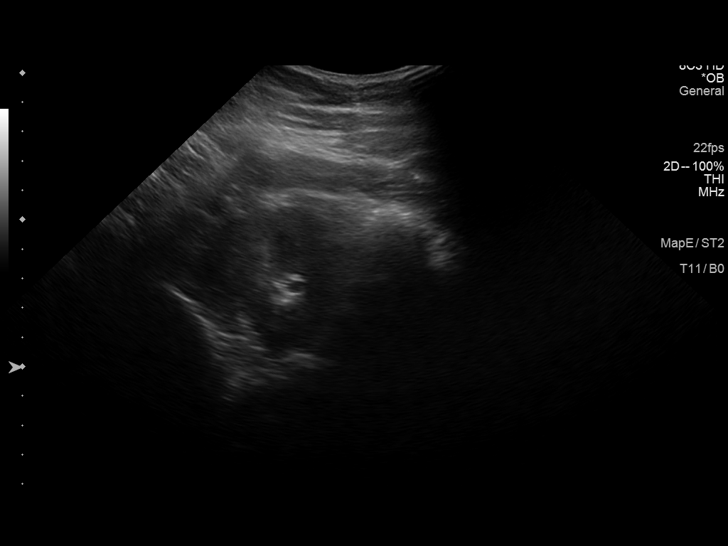
[im 10/27]
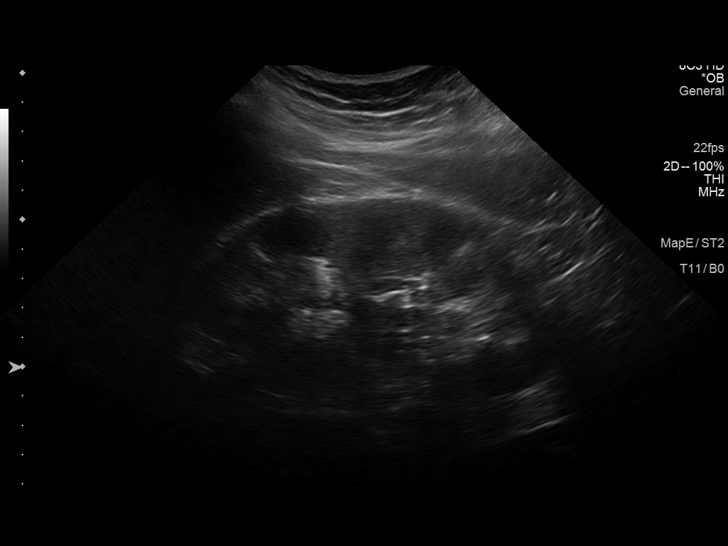
[im 12/27]
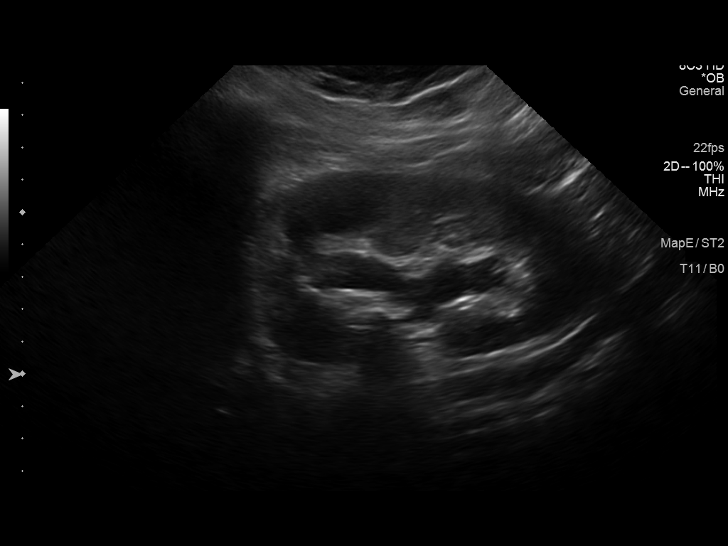
[im 15/27]
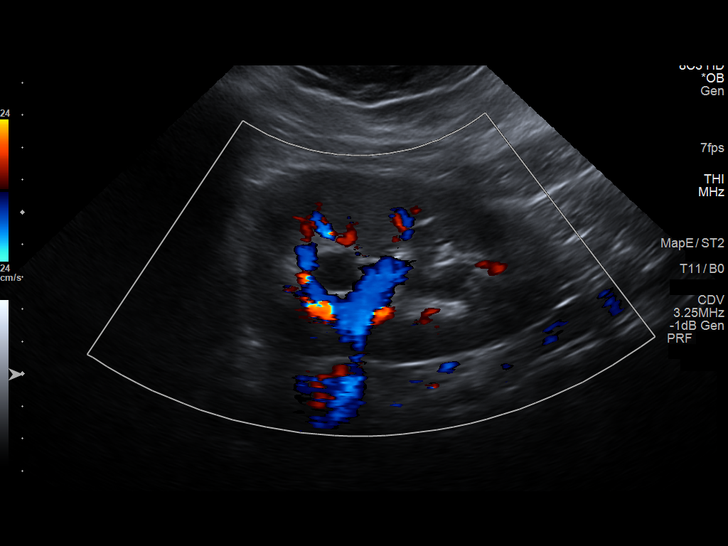
[im 17/27]
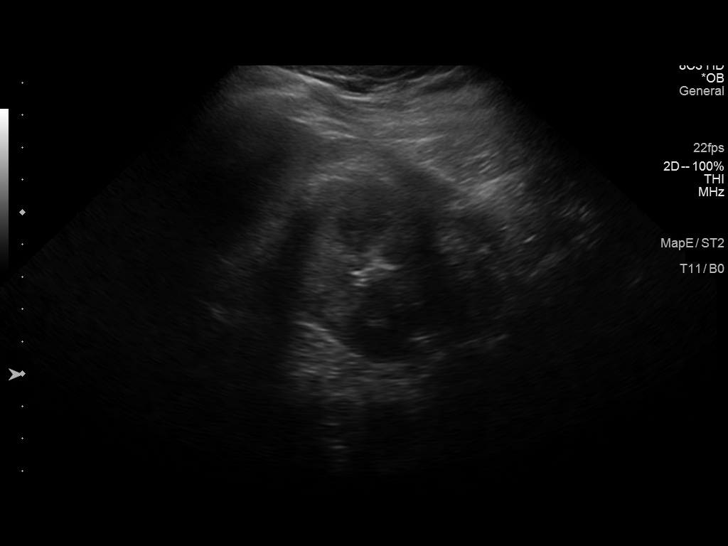
[im 18/27]
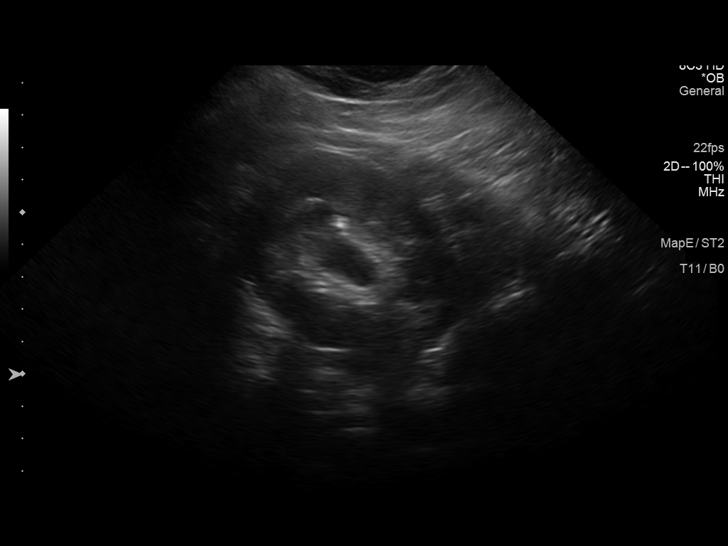
[im 20/27]
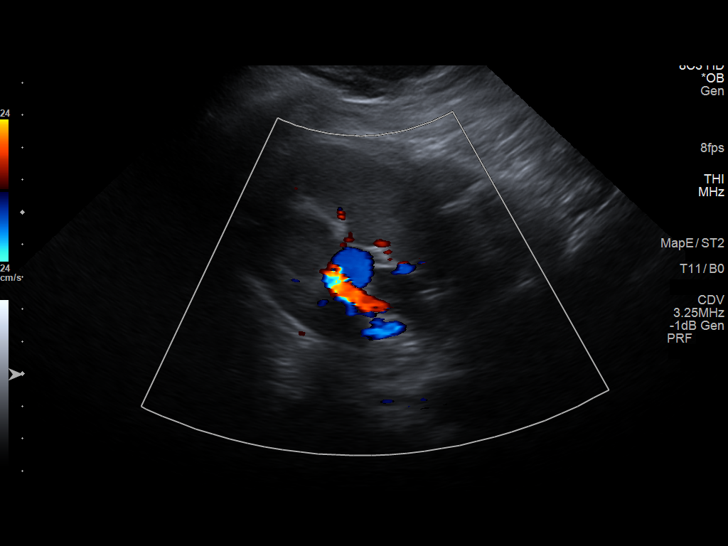
[im 22/27]
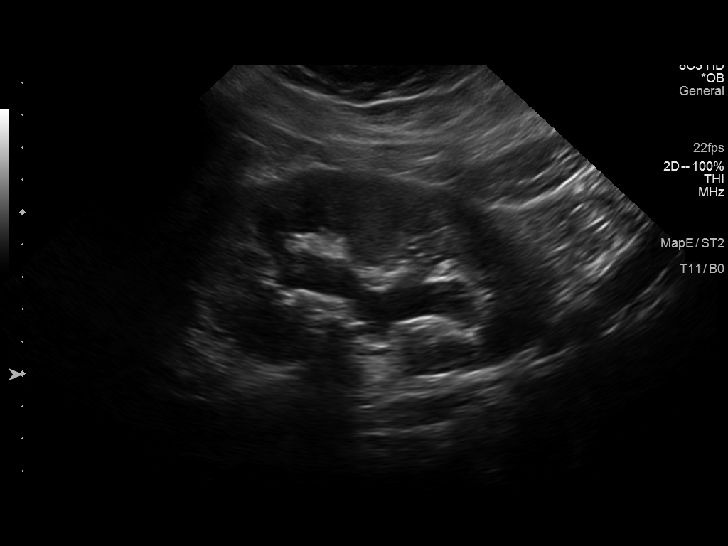
[im 24/27]
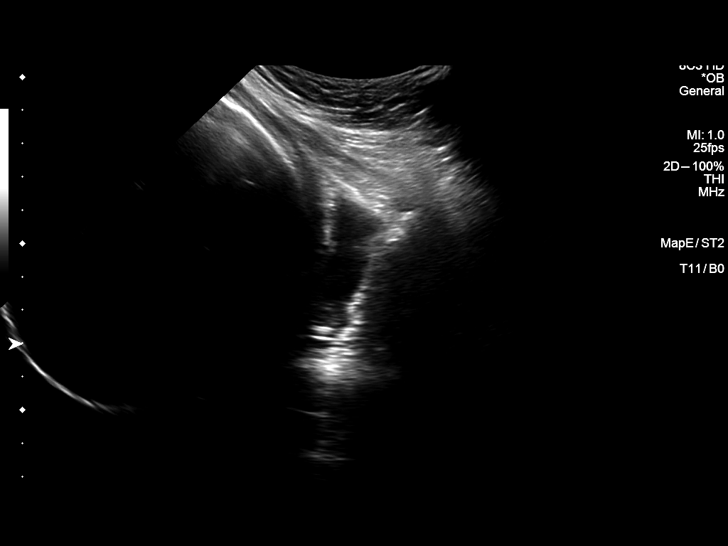
[im 27/27]
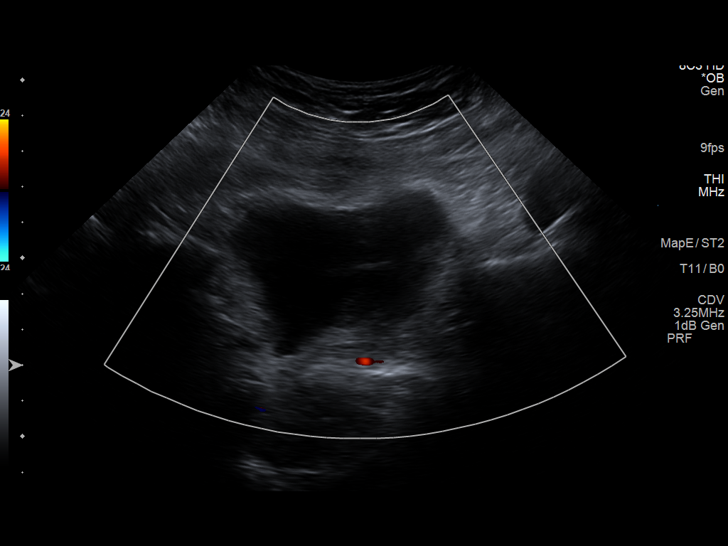

[14 of 25 positions shown; findings below may reference images not displayed]

FINDINGS: Right Kidney:

Length: 12.1 cm. Mild hydronephrosis is identified. Normal renal
echogenicity noted. There is no evidence of solid mass or definite
renal calculi.

Left Kidney:

Length: 12.2 cm.. Mild hydronephrosis is identified. Normal renal
echogenicity noted. There is no evidence of solid mass or definite
renal calculi.

Bladder:

Appears normal for degree of bladder distention. The ureteral jets
were difficult to visualize secondary to technical factors.
IMPRESSION: Mild bilateral hydronephrosis - most likely secondary to
hydronephrosis of pregnancy, but an obstructing calculus is
difficult to entirely exclude.

No other significant abnormalities.

## 2016-10-25 ENCOUNTER — Ambulatory Visit (INDEPENDENT_AMBULATORY_CARE_PROVIDER_SITE_OTHER): Payer: 59 | Admitting: *Deleted

## 2016-10-25 DIAGNOSIS — Z23 Encounter for immunization: Secondary | ICD-10-CM

## 2016-10-25 NOTE — Progress Notes (Signed)
Need for immunization

## 2017-03-01 DIAGNOSIS — Z1231 Encounter for screening mammogram for malignant neoplasm of breast: Secondary | ICD-10-CM | POA: Diagnosis not present

## 2017-10-18 ENCOUNTER — Ambulatory Visit (INDEPENDENT_AMBULATORY_CARE_PROVIDER_SITE_OTHER): Payer: 59 | Admitting: Physician Assistant

## 2017-10-18 ENCOUNTER — Encounter: Payer: Self-pay | Admitting: Physician Assistant

## 2017-10-18 VITALS — BP 128/87 | HR 76 | Temp 98.0°F | Ht 67.0 in | Wt 183.0 lb

## 2017-10-18 DIAGNOSIS — L237 Allergic contact dermatitis due to plants, except food: Secondary | ICD-10-CM | POA: Diagnosis not present

## 2017-10-18 MED ORDER — METHYLPREDNISOLONE ACETATE 80 MG/ML IJ SUSP
80.0000 mg | Freq: Once | INTRAMUSCULAR | Status: AC
  Administered 2017-10-18: 80 mg via INTRAMUSCULAR

## 2017-10-18 MED ORDER — LORATADINE 10 MG PO TABS: 10.0000 mg | ORAL_TABLET | Freq: Every day | ORAL | 11 refills | Status: DC

## 2017-10-19 NOTE — Progress Notes (Signed)
BP 128/87   Pulse 76   Temp 98 F (36.7 C) (Oral)   Ht 5\' 7"  (1.702 m)   Wt 183 lb (83 kg)   BMI 28.66 kg/m    Subjective:    Patient ID: Julia Burton, female    DOB: [birth year], 36 y.o.   MRN: [id number]  HPI: Julia Burton is a 38 y.o. female presenting on 10/18/2017 for Rash  Over the past 2 days patient has had increasing amounts of blisters on a red base with significant itching.  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  She has never had poison oak in the past.  She even has areas on her forehead and neck.  Past Medical History:  Diagnosis Date  . Anemia   . Anxiety   . History of kidney stones   . Medical history non-contributory    Relevant past medical, surgical, family and social history reviewed and updated as indicated. Interim medical history since our last visit reviewed. Allergies and medications reviewed and updated. DATA REVIEWED: CHART IN EPIC  Family History reviewed for pertinent findings.  Review of Systems  Constitutional: Negative.   HENT: Negative.   Eyes: Negative.   Respiratory: Negative.   Gastrointestinal: Negative.   Genitourinary: Negative.   Skin: Positive for color change and rash.    Allergies as of 10/18/2017   No Known Allergies     Medication List        Accurate as of 10/18/17 11:59 PM. Always use your most recent med list.          ALTAVERA 0.15-30 MG-MCG tablet Generic drug:  levonorgestrel-ethinyl estradiol   FLUoxetine 10 MG capsule Commonly known as:  PROZAC Take 10 mg by mouth daily.   loratadine 10 MG tablet Commonly known as:  CLARITIN Take 1 tablet (10 mg total) by mouth daily.          Objective:    BP 128/87   Pulse 76   Temp 98 F (36.7 C) (Oral)   Ht 5\' 7"  (1.702 m)   Wt 183 lb (83 kg)   BMI 28.66 kg/m   No Known Allergies  Wt Readings from Last 3 Encounters:  10/18/17 183 lb (83 kg)  09/01/14 166 lb (75.3 kg)  11/06/13 220 lb (99.8 kg)      Physical Exam  Constitutional: She is oriented to person, place, and time. She appears well-developed and well-nourished.  HENT:  Head: Normocephalic and atraumatic.  Eyes: Pupils are equal, round, and reactive to light. Conjunctivae and EOM are normal.  Cardiovascular: Normal rate, regular rhythm, normal heart sounds and intact distal pulses.  Pulmonary/Chest: Effort normal and breath sounds normal.  Abdominal: Soft. Bowel sounds are normal.  Neurological: She is alert and oriented to person, place, and time. She has normal reflexes.  Skin: Skin is warm and dry. Rash noted. Rash is vesicular. There is erythema.     Multiple areas of vesicles on erythematous base with linear configuration  Psychiatric: She has a normal mood and affect. Her behavior is normal. Judgment and thought content normal.    Results for orders placed or performed in visit on 12/29/14  PPD  Result Value Ref Range   TB Skin Test Negative    Induration 0 mm      Assessment & Plan:   1. Allergic contact dermatitis due to plants, except food - loratadine (CLARITIN) 10 MG tablet; Take 1 tablet (10 mg total) by  mouth daily.  Dispense: 30 tablet; Refill: 11 - methylPREDNISolone acetate (DEPO-MEDROL) injection 80 mg   Continue all other maintenance medications as listed above.  Follow up plan: No follow-ups on file.  Educational handout given for [place] PA-C [facility] [address]  [place], [place] [address] [phone]   10/19/2017, 7:55 AM

## 2017-10-23 ENCOUNTER — Other Ambulatory Visit: Payer: Self-pay | Admitting: Family Medicine

## 2017-10-23 MED ORDER — PREDNISONE 10 MG PO TABS: ORAL_TABLET | ORAL | 0 refills | Status: DC

## 2018-01-01 ENCOUNTER — Encounter: Payer: Self-pay | Admitting: Pediatrics

## 2018-01-01 ENCOUNTER — Ambulatory Visit (INDEPENDENT_AMBULATORY_CARE_PROVIDER_SITE_OTHER): Payer: 59 | Admitting: Pediatrics

## 2018-01-01 VITALS — BP 122/86 | HR 85 | Temp 98.3°F | Ht 67.0 in | Wt 187.6 lb

## 2018-01-01 DIAGNOSIS — R3915 Urgency of urination: Secondary | ICD-10-CM

## 2018-01-01 DIAGNOSIS — F419 Anxiety disorder, unspecified: Secondary | ICD-10-CM

## 2018-01-01 LAB — MICROSCOPIC EXAMINATION
Epithelial Cells (non renal): >10 /hpf — AB (ref 0–10)
Renal Epithel, UA: NONE SEEN /hpf

## 2018-01-01 LAB — URINALYSIS, COMPLETE
BILIRUBIN UA: NEGATIVE
Glucose, UA: NEGATIVE
KETONES UA: NEGATIVE
Nitrite, UA: NEGATIVE
PH UA: 6.5 (ref 5.0–7.5)
PROTEIN UA: NEGATIVE
SPEC GRAV UA: 1.02 (ref 1.005–1.030)
Urobilinogen, Ur: 0.2 mg/dL (ref 0.2–1.0)

## 2018-01-01 MED ORDER — FLUOXETINE HCL 20 MG PO CAPS: 20.0000 mg | ORAL_CAPSULE | Freq: Every day | ORAL | 4 refills | Status: DC

## 2018-01-01 NOTE — Patient Instructions (Signed)

## 2018-01-01 NOTE — Addendum Note (Signed)
Addended by: Liliane Bade on: 01/01/2018 10:46 AM   Modules accepted: Orders

## 2018-01-01 NOTE — Progress Notes (Signed)
Subjective:   Patient ID: Julia Burton, female    DOB: November 02, 1980, 37 y.o.   MRN: [id number] CC: Anxiety and Urinary Urgency  HPI: Julia Burton is a 37 y.o. female   Anxiety: diagnosed at 37yo, has been on fluoxetine for years. At one point tried lexapro, caused insomnia. Fluoxetine has been helping, noticing she is more short with her family, irritable when she does not want to be.  Mood has been fine.  Sleeping is fine.  Has been using calming techniques she learned over the years to help the symptoms.  Noticing that she is having to do that while usual.  Urinary urgency: Past month started at night only, now with most voids, will empty bladder, then feel like she has urge to go again again immediately, has to sit for a while before small amount of urine.  No urgency between voids. No fevers, no abdominal pain.  No dysuria.  Drinking 1 cup of caffeine a day.   GAD 7 : Generalized Anxiety Score 01/01/2018  Nervous, Anxious, on Edge 3  Control/stop worrying 3  Worry too much - different things 3  Trouble relaxing 3  Restless 0  Easily annoyed or irritable 3  Afraid - awful might happen 1  Total GAD 7 Score 16  Anxiety Difficulty Somewhat difficult   Depression screen [facility] 2/9 01/01/2018 10/18/2017  Decreased Interest 1 0  Down, Depressed, Hopeless 1 0  PHQ - 2 Score 2 0  Altered sleeping 1 -  Tired, decreased energy 1 -  Change in appetite 1 -  Feeling bad or failure about yourself  1 -  Trouble concentrating 0 -  Moving slowly or fidgety/restless 0 -  Suicidal thoughts 0 -  PHQ-9 Score 6 -  Difficult doing work/chores Somewhat difficult -     Relevant past medical, surgical, family and social history reviewed. Allergies and medications reviewed and updated. Social History   Tobacco Use  Smoking Status Never Smoker  Smokeless Tobacco Never Used   ROS: Per HPI   Objective:    BP 122/86   Pulse 85   Temp 98.3 F (36.8 C) (Oral)   Ht 5\' 7"  (1.702 m)   Wt  187 lb 9.6 oz (85.1 kg)   BMI 29.38 kg/m   Wt Readings from Last 3 Encounters:  01/01/18 187 lb 9.6 oz (85.1 kg)  10/18/17 183 lb (83 kg)  09/01/14 166 lb (75.3 kg)    Gen: NAD, alert, cooperative with exam, NCAT EYES: EOMI, no conjunctival injection, or no icterus ENT:  TMs pearly gray b/l, OP without erythema LYMPH: no cervical LAD Neck: Normal thyroid CV: NRRR, normal S1/S2, no murmur, distal pulses 2+ b/l Resp: CTABL, no wheezes, normal WOB Abd: +BS, soft, NTND. no guarding or organomegaly Ext: No edema, warm Neuro: Alert and oriented, strength equal b/l UE and LE, coordination grossly normal MSK: normal muscle bulk  Assessment & Plan:  Julia Burton was seen today for anxiety and urinary urgency.  Diagnoses and all orders for this visit:  Anxiety Increased dose of below.  If no improvement in symptoms within 2 weeks, patient to let us know, will increase medicine.  Follow-up in 2 months.  Sooner if needed.  Return precautions discussed. -     FLUoxetine (PROZAC) 20 MG capsule; Take 1 capsule (20 mg total) by mouth daily.  Urinary urgency Small amount white cells, large number epithelial cells.  Avoid caffeine.  We will follow-up culture. -     Urinalysis, Complete -  Urine Culture; Future   Follow up plan: Return in about 2 months (around 03/03/2018). Julia Found, MD [place]

## 2018-01-04 ENCOUNTER — Other Ambulatory Visit: Payer: Self-pay | Admitting: Pediatrics

## 2018-01-04 ENCOUNTER — Encounter: Payer: Self-pay | Admitting: Pediatrics

## 2018-01-04 LAB — URINE CULTURE

## 2018-01-04 MED ORDER — NITROFURANTOIN MONOHYD MACRO 100 MG PO CAPS: 100.0000 mg | ORAL_CAPSULE | Freq: Two times a day (BID) | ORAL | 0 refills | Status: AC

## 2018-01-10 ENCOUNTER — Encounter: Payer: Self-pay | Admitting: Pediatrics

## 2018-01-10 NOTE — Telephone Encounter (Signed)
I called patient and she states that she has finished all of the macrobid and she has not noticed any difference since finishing her antibiotic.

## 2018-01-23 ENCOUNTER — Other Ambulatory Visit: Payer: Self-pay | Admitting: Pediatrics

## 2018-01-23 DIAGNOSIS — F419 Anxiety disorder, unspecified: Secondary | ICD-10-CM

## 2018-02-07 DIAGNOSIS — N132 Hydronephrosis with renal and ureteral calculous obstruction: Secondary | ICD-10-CM | POA: Diagnosis not present

## 2018-02-07 DIAGNOSIS — R109 Unspecified abdominal pain: Secondary | ICD-10-CM | POA: Diagnosis not present

## 2018-02-07 DIAGNOSIS — N200 Calculus of kidney: Secondary | ICD-10-CM | POA: Diagnosis not present

## 2018-02-09 DIAGNOSIS — Z801 Family history of malignant neoplasm of trachea, bronchus and lung: Secondary | ICD-10-CM | POA: Diagnosis not present

## 2018-02-09 DIAGNOSIS — Z01419 Encounter for gynecological examination (general) (routine) without abnormal findings: Secondary | ICD-10-CM | POA: Diagnosis not present

## 2018-02-09 DIAGNOSIS — Z803 Family history of malignant neoplasm of breast: Secondary | ICD-10-CM | POA: Diagnosis not present

## 2018-02-09 DIAGNOSIS — Z6829 Body mass index (BMI) 29.0-29.9, adult: Secondary | ICD-10-CM | POA: Diagnosis not present

## 2018-02-09 DIAGNOSIS — Z808 Family history of malignant neoplasm of other organs or systems: Secondary | ICD-10-CM | POA: Diagnosis not present

## 2018-03-05 ENCOUNTER — Encounter: Payer: Self-pay | Admitting: Physician Assistant

## 2018-03-05 ENCOUNTER — Ambulatory Visit (INDEPENDENT_AMBULATORY_CARE_PROVIDER_SITE_OTHER): Payer: 59 | Admitting: Physician Assistant

## 2018-03-05 DIAGNOSIS — F419 Anxiety disorder, unspecified: Secondary | ICD-10-CM

## 2018-03-05 MED ORDER — FLUOXETINE HCL 20 MG PO CAPS: ORAL_CAPSULE | ORAL | 3 refills | Status: DC

## 2018-03-07 DIAGNOSIS — F419 Anxiety disorder, unspecified: Secondary | ICD-10-CM | POA: Insufficient documentation

## 2018-03-07 NOTE — Progress Notes (Signed)
BP 125/86   Pulse 87   Temp 97.6 F (36.4 C) (Oral)   Ht 5\' 7"  (1.702 m)   Wt 183 lb 12.8 oz (83.4 kg)   BMI 28.79 kg/m    Subjective:    Patient ID: Julia Burton, female    DOB: 1980/05/22, 38 y.o.   MRN: [id number]  HPI: Julia Burton is a 38 y.o. female presenting on 03/05/2018 for Anxiety (2 months)  This patient comes in for periodic recheck on medications and conditions including anxiety. Reports that she is doing very well.  Depression screen [facility] 2/9 03/05/2018 01/01/2018 10/18/2017  Decreased Interest 1 1 0  Down, Depressed, Hopeless 1 1 0  PHQ - 2 Score 2 2 0  Altered sleeping 1 1 -  Tired, decreased energy 1 1 -  Change in appetite 0 1 -  Feeling bad or failure about yourself  0 1 -  Trouble concentrating 0 0 -  Moving slowly or fidgety/restless 0 0 -  Suicidal thoughts 0 0 -  PHQ-9 Score 4 6 -  Difficult doing work/chores - Somewhat difficult -     All medications are reviewed today. There are no reports of any problems with the medications. All of the medical conditions are reviewed and updated.  Lab work is reviewed and will be ordered as medically necessary. There are no new problems reported with today's visit.   Past Medical History:  Diagnosis Date  . Anemia   . Anxiety   . History of kidney stones   . Medical history non-contributory    Relevant past medical, surgical, family and social history reviewed and updated as indicated. Interim medical history since our last visit reviewed. Allergies and medications reviewed and updated. DATA REVIEWED: CHART IN EPIC  Family History reviewed for pertinent findings.  Review of Systems  Constitutional: Negative.   HENT: Negative.   Eyes: Negative.   Respiratory: Negative.   Gastrointestinal: Negative.   Genitourinary: Negative.     Allergies as of 03/05/2018   No Known Allergies     Medication List       Accurate as of March 05, 2018 11:59 PM. Always use your most recent med list.        ALTAVERA 0.15-30 MG-MCG tablet Generic drug:  levonorgestrel-ethinyl estradiol   FLUoxetine 20 MG capsule Commonly known as:  PROZAC TAKE 1 CAPSULE BY MOUTH EVERY DAY          Objective:    BP 125/86   Pulse 87   Temp 97.6 F (36.4 C) (Oral)   Ht 5\' 7"  (1.702 m)   Wt 183 lb 12.8 oz (83.4 kg)   BMI 28.79 kg/m   No Known Allergies  Wt Readings from Last 3 Encounters:  03/05/18 183 lb 12.8 oz (83.4 kg)  01/01/18 187 lb 9.6 oz (85.1 kg)  10/18/17 183 lb (83 kg)    Physical Exam Constitutional:      Appearance: She is well-developed.  HENT:     Head: Normocephalic and atraumatic.  Eyes:     Conjunctiva/sclera: Conjunctivae normal.     Pupils: Pupils are equal, round, and reactive to light.  Cardiovascular:     Rate and Rhythm: Normal rate and regular rhythm.     Heart sounds: Normal heart sounds.  Pulmonary:     Effort: Pulmonary effort is normal.     Breath sounds: Normal breath sounds.  Abdominal:     General: Bowel sounds are normal.     Palpations:  Abdomen is soft.  Skin:    General: Skin is warm and dry.     Findings: No rash.  Neurological:     Mental Status: She is alert and oriented to person, place, and time.     Deep Tendon Reflexes: Reflexes are normal and symmetric.  Psychiatric:        Behavior: Behavior normal.        Thought Content: Thought content normal.        Judgment: Judgment normal.         Assessment & Plan:   1. Anxiety - FLUoxetine (PROZAC) 20 MG capsule; TAKE 1 CAPSULE BY MOUTH EVERY DAY  Dispense: 90 capsule; Refill: 3   Continue all other maintenance medications as listed above.  Follow up plan: Return in about 1 year (around 03/06/2019).  Educational handout given for [place] PA-C [place] [address]  [place], [place] [address] [phone]   03/07/2018, 7:58 AM

## 2018-05-02 ENCOUNTER — Other Ambulatory Visit: Payer: Self-pay | Admitting: Physician Assistant

## 2018-05-02 DIAGNOSIS — F419 Anxiety disorder, unspecified: Secondary | ICD-10-CM

## 2018-06-03 ENCOUNTER — Other Ambulatory Visit: Payer: Self-pay | Admitting: Physician Assistant

## 2018-06-03 DIAGNOSIS — F419 Anxiety disorder, unspecified: Secondary | ICD-10-CM

## 2018-06-06 ENCOUNTER — Encounter: Payer: Self-pay | Admitting: Physician Assistant

## 2018-08-29 ENCOUNTER — Other Ambulatory Visit: Payer: Self-pay | Admitting: Physician Assistant

## 2018-08-29 DIAGNOSIS — F419 Anxiety disorder, unspecified: Secondary | ICD-10-CM

## 2018-10-26 ENCOUNTER — Ambulatory Visit (INDEPENDENT_AMBULATORY_CARE_PROVIDER_SITE_OTHER): Payer: 59 | Admitting: Family Medicine

## 2018-10-26 ENCOUNTER — Other Ambulatory Visit: Payer: Self-pay

## 2018-10-26 DIAGNOSIS — N3000 Acute cystitis without hematuria: Secondary | ICD-10-CM

## 2018-10-26 MED ORDER — CEPHALEXIN 500 MG PO CAPS: 500.0000 mg | ORAL_CAPSULE | Freq: Two times a day (BID) | ORAL | 0 refills | Status: AC

## 2018-10-26 NOTE — Progress Notes (Signed)
Telephone visit  Subjective: CC:UTI PCP: Terald Sleeper, PA-C [id number] Julia Burton is Julia 38 y.o. female calls for telephone consult today. Patient provides verbal consent for consult held via phone.  Location of patient: home Location of provider: Working remotely from home Others present for call: none  1. Urinary symptoms Patient reports Julia 1-2 day h/o lower abdominal pressure/ pain and dysuria.  She reports urgency.  Denies urinary frequency, hematuria, fevers, chills, abdominal pain, nausea, vomiting, back pain, vaginal discharge.  Patient has used Tylenol for symptoms.  Patient denies Julia h/o frequent or recurrent UTIs.  LMP: last week.  ROS: Per HPI  No Known Allergies Past Medical History:  Diagnosis Date  . Anemia   . Anxiety   . History of kidney stones   . Medical history non-contributory     Current Outpatient Medications:  .  ALTAVERA 0.15-30 MG-MCG tablet, , Disp: , Rfl: 5 .  FLUoxetine (PROZAC) 20 MG capsule, TAKE 1 CAPSULE BY MOUTH EVERY DAY, Disp: 90 capsule, Rfl: 0  Assessment/ Plan: 38 y.o. female   1. Acute cystitis without hematuria Clinically consistent with UTI.  Empiric treatment with Keflex sent.  Push fluids.  Return precautions. She voiced good understanding. - cephALEXin (KEFLEX) 500 MG capsule; Take 1 capsule (500 mg total) by mouth 2 (two) times daily for 7 days.  Dispense: 14 capsule; Refill: 0   Start time: 12:46pm End time: 12:50pm  Total time spent on patient care (including telephone call/ virtual visit): 11 minutes  [place], [place] [phone]

## 2018-10-26 NOTE — Patient Instructions (Signed)
Urinary Tract Infection, Adult A urinary tract infection (UTI) is an infection of any part of the urinary tract. The urinary tract includes:  The kidneys.  The ureters.  The bladder.  The urethra. These organs make, store, and get rid of pee (urine) in the body. What are the causes? This is caused by germs (bacteria) in your genital area. These germs grow and cause swelling (inflammation) of your urinary tract. What increases the risk? You are more likely to develop this condition if:  You have a small, thin tube (catheter) to drain pee.  You cannot control when you pee or poop (incontinence).  You are female, and: ? You use these methods to prevent pregnancy: ? A medicine that kills sperm (spermicide). ? A device that blocks sperm (diaphragm). ? You have low levels of a female hormone (estrogen). ? You are pregnant.  You have genes that add to your risk.  You are sexually active.  You take antibiotic medicines.  You have trouble peeing because of: ? A prostate that is bigger than normal, if you are female. ? A blockage in the part of your body that drains pee from the bladder (urethra). ? A kidney stone. ? A nerve condition that affects your bladder (neurogenic bladder). ? Not getting enough to drink. ? Not peeing often enough.  You have other conditions, such as: ? Diabetes. ? A weak disease-fighting system (immune system). ? Sickle cell disease. ? Gout. ? Injury of the spine. What are the signs or symptoms? Symptoms of this condition include:  Needing to pee right away (urgently).  Peeing often.  Peeing small amounts often.  Pain or burning when peeing.  Blood in the pee.  Pee that smells bad or not like normal.  Trouble peeing.  Pee that is cloudy.  Fluid coming from the vagina, if you are female.  Pain in the belly or lower back. Other symptoms include:  Throwing up (vomiting).  No urge to eat.  Feeling mixed up (confused).  Being tired  and grouchy (irritable).  A fever.  Watery poop (diarrhea). How is this treated? This condition may be treated with:  Antibiotic medicine.  Other medicines.  Drinking enough water. Follow these instructions at home:  Medicines  Take over-the-counter and prescription medicines only as told by your doctor.  If you were prescribed an antibiotic medicine, take it as told by your doctor. Do not stop taking it even if you start to feel better. General instructions  Make sure you: ? Pee until your bladder is empty. ? Do not hold pee for a long time. ? Empty your bladder after sex. ? Wipe from front to back after pooping if you are a female. Use each tissue one time when you wipe.  Drink enough fluid to keep your pee pale yellow.  Keep all follow-up visits as told by your doctor. This is important. Contact a doctor if:  You do not get better after 1-2 days.  Your symptoms go away and then come back. Get help right away if:  You have very bad back pain.  You have very bad pain in your lower belly.  You have a fever.  You are sick to your stomach (nauseous).  You are throwing up. Summary  A urinary tract infection (UTI) is an infection of any part of the urinary tract.  This condition is caused by germs in your genital area.  There are many risk factors for a UTI. These include having a small, thin   tube to drain pee and not being able to control when you pee or poop.  Treatment includes antibiotic medicines for germs.  Drink enough fluid to keep your pee pale yellow. This information is not intended to replace advice given to you by your health care provider. Make sure you discuss any questions you have with your health care provider. Document Released: 07/27/2007 Document Revised: 01/25/2018 Document Reviewed: 08/17/2017 Elsevier Patient Education  2020 Elsevier Inc.  

## 2018-12-01 ENCOUNTER — Other Ambulatory Visit: Payer: Self-pay | Admitting: Physician Assistant

## 2018-12-01 DIAGNOSIS — F419 Anxiety disorder, unspecified: Secondary | ICD-10-CM

## 2018-12-03 NOTE — Telephone Encounter (Signed)
OV 03/05/18 rtc 1 yr

## 2019-02-04 ENCOUNTER — Encounter: Payer: Self-pay | Admitting: Physician Assistant

## 2019-02-04 ENCOUNTER — Ambulatory Visit (INDEPENDENT_AMBULATORY_CARE_PROVIDER_SITE_OTHER): Payer: 59 | Admitting: Physician Assistant

## 2019-02-04 DIAGNOSIS — F419 Anxiety disorder, unspecified: Secondary | ICD-10-CM | POA: Diagnosis not present

## 2019-02-04 MED ORDER — FLUOXETINE HCL 40 MG PO CAPS: 40.0000 mg | ORAL_CAPSULE | Freq: Every day | ORAL | 11 refills | Status: DC

## 2019-02-04 NOTE — Progress Notes (Signed)
       Telephone visit  Subjective: [id number] PCP: Terald Sleeper, PA-C [id number] Julia Burton is Julia 38 y.o. female calls for telephone consult today. Patient provides verbal consent for consult held via phone.  Patient is identified with 2 separate identifiers.  At this time the entire area is on COVID-19 social distancing and stay home orders are in place.  Patient is of higher risk and therefore we are performing this by Julia virtual method.  Location of patient: home Location of provider: [facility] Others present for call: no  This patient is having Julia recheck on her generalized anxiety disorder she has done exceptionally well taking the Prozac 20 mg 1 daily over the past year however in recent weeks she has undergone Julia lot more stress and does feel like she is more irritable.  She states that 5 out of 7 days she does really well but the other 2 she does have for anxiety.  It is often related to stressors outside of her control.  But she feels bad that she takes it out on her family  Depression screen [facility] 2/9 02/04/2019 03/05/2018 01/01/2018 10/18/2017  Decreased Interest 0 1 1 0  Down, Depressed, Hopeless 0 1 1 0  PHQ - 2 Score 0 2 2 0  Altered sleeping - 1 1 -  Tired, decreased energy - 1 1 -  Change in appetite - 0 1 -  Feeling bad or failure about yourself  - 0 1 -  Trouble concentrating - 0 0 -  Moving slowly or fidgety/restless - 0 0 -  Suicidal thoughts - 0 0 -  PHQ-9 Score - 4 6 -  Difficult doing work/chores - - Somewhat difficult -   GAD 7 : Generalized Anxiety Score 02/04/2019 01/01/2018  Nervous, Anxious, on Edge 1 3  Control/stop worrying 1 3  Worry too much - different things 1 3  Trouble relaxing 1 3  Restless 0 0  Easily annoyed or irritable 2 3  Afraid - awful might happen 0 1  Total GAD 7 Score 6 16  Anxiety Difficulty Somewhat difficult Somewhat difficult       ROS: Per HPI  No Known Allergies Past Medical History:  Diagnosis Date  . Anemia   .  Anxiety   . History of kidney stones   . Medical history non-contributory     Current Outpatient Medications:  .  FLUoxetine (PROZAC) 40 MG capsule, Take 1 capsule (40 mg total) by mouth daily., Disp: 30 capsule, Rfl: 11  Assessment/ Plan: 38 y.o. female   1. Anxiety Recheck 4 weeks or sooner if needed - FLUoxetine (PROZAC) 40 MG capsule; Take 1 capsule (40 mg total) by mouth daily.  Dispense: 30 capsule; Refill: 11   No follow-ups on file.  Continue all other maintenance medications as listed above.  Start time: 1:50 PM End time: 2:01 PM  Meds ordered this encounter  Medications  . FLUoxetine (PROZAC) 40 MG capsule    Sig: Take 1 capsule (40 mg total) by mouth daily.    Dispense:  30 capsule    Refill:  11    Order Specific Question:   Supervising Provider    Answer:   Janora Norlander [id number]    Particia Nearing PA-C [place] [phone]

## 2019-03-01 ENCOUNTER — Other Ambulatory Visit: Payer: Self-pay | Admitting: Physician Assistant

## 2019-03-01 DIAGNOSIS — F419 Anxiety disorder, unspecified: Secondary | ICD-10-CM

## 2019-10-04 ENCOUNTER — Other Ambulatory Visit: Payer: Self-pay

## 2019-10-04 ENCOUNTER — Ambulatory Visit (INDEPENDENT_AMBULATORY_CARE_PROVIDER_SITE_OTHER): Payer: 59 | Admitting: Physician Assistant

## 2019-10-04 DIAGNOSIS — Z1283 Encounter for screening for malignant neoplasm of skin: Secondary | ICD-10-CM

## 2019-10-04 DIAGNOSIS — L814 Other melanin hyperpigmentation: Secondary | ICD-10-CM

## 2019-10-04 DIAGNOSIS — D485 Neoplasm of uncertain behavior of skin: Secondary | ICD-10-CM | POA: Diagnosis not present

## 2019-10-04 DIAGNOSIS — D1800 Hemangioma unspecified site: Secondary | ICD-10-CM | POA: Diagnosis not present

## 2019-10-04 DIAGNOSIS — L821 Other seborrheic keratosis: Secondary | ICD-10-CM

## 2019-10-04 DIAGNOSIS — L578 Other skin changes due to chronic exposure to nonionizing radiation: Secondary | ICD-10-CM

## 2019-10-04 NOTE — Patient Instructions (Signed)
Biopsy, Surgery (Curettage) & Surgery (Excision) Aftercare Instructions  1. Okay to remove bandage in 24 hours  2. Wash area with soap and water  3. Apply Vaseline to area twice daily until healed (Not Neosporin)  4. Okay to cover with a Band-Aid to decrease the chance of infection or prevent irritation from clothing; also it's okay to uncover lesion at home.  5. Suture instructions: return to our office in 7-10 or 10-14 days for a nurse visit for suture removal. Variable healing with sutures, if pain or itching occurs call our office. It's okay to shower or bathe 24 hours after sutures are given.  6. The following risks may occur after a biopsy, curettage or excision: bleeding, scarring, discoloration, recurrence, infection (redness, yellow drainage, pain or swelling).  7. If your results are positive we will contact you, if you do not hear from [place] review your MyChart.  Stay Well

## 2019-10-09 ENCOUNTER — Telehealth: Payer: Self-pay | Admitting: Physician Assistant

## 2019-10-09 ENCOUNTER — Encounter: Payer: Self-pay | Admitting: Physician Assistant

## 2019-10-09 NOTE — Telephone Encounter (Signed)
Patient left message on office voice mail saying that she saw Robyne Askew, [facility] last Friday and that one of her stitches has come out.  Site is not bleeding or oozing, but should she be concerned about it?  (Chart # 2588)

## 2019-10-09 NOTE — Progress Notes (Signed)
   Follow-Up Visit   Subjective  Julia Burton is a 39 y.o. female who presents for the following: Annual Exam (left thigh x months she picks).   The following portions of the chart were reviewed this encounter and updated as appropriate: Tobacco  Allergies  Meds  Problems  Med Hx  Surg Hx  Fam Hx      Objective  Well appearing patient in no apparent distress; mood and affect are within normal limits.  A full examination was performed including scalp, head, eyes, ears, nose, lips, neck, chest, axillae, abdomen, back, buttocks, bilateral upper extremities, bilateral lower extremities, hands, feet, fingers, toes, fingernails, and toenails. All findings within normal limits unless otherwise noted below.  Objective  Left Thigh - Anterior: White nodule     Objective  Head - to toe: No atypical nevi No signs of non-mole skin cancer.    Assessment & Plan  Neoplasm of uncertain behavior of skin Left Thigh - Anterior  Skin / nail biopsy Type of biopsy: punch   Informed consent: discussed and consent obtained   Timeout: patient name, date of birth, surgical site, and procedure verified   Procedure prep:  Patient was prepped and draped in usual sterile fashion Prep type:  Chlorhexidine Suture type: nylon   Suture removal (days):  14 Hemostasis achieved with: suture   Post-procedure details: wound care instructions given    Specimen 1 - Surgical pathology Differential Diagnosis: DF Check Margins: No  Screening exam for skin cancer Head - to toe  Skin exams  Lentigines - Scattered tan macules - Discussed due to sun exposure - Benign, observe - Call for any changes  Seborrheic Keratoses - Stuck-on, waxy, tan-brown papules and plaques  - Discussed benign etiology and prognosis. - Observe - Call for any changes  Hemangiomas - Red papules - Discussed benign nature - Observe - Call for any changes  Actinic Damage - diffuse scaly erythematous macules with  underlying dyspigmentation - Recommend daily broad spectrum sunscreen SPF 30+ to sun-exposed areas, reapply every 2 hours as needed.  - Call for new or changing lesions.  Skin cancer screening performed today.  I, Akisha Sturgill, PA-C, have reviewed all documentation's for this visit.  The documentation on 10/09/19 for the exam, diagnosis, procedures and orders are all accurate and complete.

## 2020-02-26 ENCOUNTER — Ambulatory Visit: Payer: 59 | Admitting: Family Medicine

## 2020-08-17 ENCOUNTER — Encounter: Payer: Self-pay | Admitting: Physician Assistant

## 2021-02-05 ENCOUNTER — Ambulatory Visit: Payer: 59 | Admitting: Family Medicine

## 2021-02-26 ENCOUNTER — Ambulatory Visit: Payer: 59 | Admitting: Family Medicine

## 2021-03-01 ENCOUNTER — Encounter: Payer: Self-pay | Admitting: Family Medicine

## 2021-03-16 ENCOUNTER — Encounter: Payer: Self-pay | Admitting: Nurse Practitioner

## 2021-03-16 ENCOUNTER — Ambulatory Visit: Payer: 59 | Admitting: Nurse Practitioner

## 2021-03-16 VITALS — BP 135/83 | HR 96 | Ht 67.0 in | Wt 207.0 lb

## 2021-03-16 DIAGNOSIS — R300 Dysuria: Secondary | ICD-10-CM | POA: Diagnosis not present

## 2021-03-16 LAB — MICROSCOPIC EXAMINATION
RBC, Urine: >30 /hpf — AB (ref 0–2)
Renal Epithel, UA: NONE SEEN /hpf
WBC, UA: >30 /hpf — AB (ref 0–5)

## 2021-03-16 LAB — URINALYSIS, ROUTINE W REFLEX MICROSCOPIC
Bilirubin, UA: NEGATIVE
Glucose, UA: NEGATIVE
Ketones, UA: NEGATIVE
Nitrite, UA: POSITIVE — AB
Specific Gravity, UA: >1.03 — ABNORMAL HIGH (ref 1.005–1.030)
Urobilinogen, Ur: 0.2 mg/dL (ref 0.2–1.0)
pH, UA: 6 (ref 5.0–7.5)

## 2021-03-16 MED ORDER — NITROFURANTOIN MONOHYD MACRO 100 MG PO CAPS: 100.0000 mg | ORAL_CAPSULE | Freq: Two times a day (BID) | ORAL | 0 refills | Status: DC

## 2021-03-16 MED ORDER — PHENAZOPYRIDINE HCL 95 MG PO TABS: 95.0000 mg | ORAL_TABLET | Freq: Three times a day (TID) | ORAL | 0 refills | Status: DC | PRN

## 2021-03-16 NOTE — Progress Notes (Signed)
Acute Office Visit  Subjective:    Patient ID: Julia Burton, female    DOB: 03/26/1980, 41 y.o.   MRN: [id number]  Chief Complaint  Patient presents with   Urinary Tract Infection    Left urine this am    Urinary Tract Infection  This is a new problem. The current episode started yesterday. The problem occurs every urination. The problem has been gradually worsening. The quality of the pain is described as burning. The pain is moderate. There has been no fever. There is No history of pyelonephritis. Associated symptoms include frequency. Pertinent negatives include no chills, flank pain, nausea or vomiting. She has tried nothing for the symptoms.    Past Medical History:  Diagnosis Date   Anemia    Anxiety    Dysplastic nevus 09/07/2010   mild, right post shoulder   History of kidney stones    Medical history non-contributory     Past Surgical History:  Procedure Laterality Date   CESAREAN SECTION     FTP   CESAREAN SECTION N/A 11/06/2013   Procedure: CESAREAN SECTION;  Surgeon: Marylynn Pearson, MD;  Location: [facility];  Service: Obstetrics;  Laterality: N/A;   KNEE SURGERY     WISDOM TOOTH EXTRACTION      No family history on file.  Social History   Socioeconomic History   Marital status: Married    Spouse name: Not on file   Number of children: Not on file   Years of education: Not on file   Highest education level: Not on file  Occupational History   Not on file  Tobacco Use   Smoking status: Never   Smokeless tobacco: Never  Vaping Use   Vaping Use: Never used  Substance and Sexual Activity   Alcohol use: No   Drug use: No   Sexual activity: Yes    Birth control/protection: None  Other Topics Concern   Not on file  Social History Narrative   Not on file   Social Determinants of Health   Financial Resource Strain: Not on file  Food Insecurity: Not on file  Transportation Needs: Not on file  Physical Activity: Not on file  Stress: Not on file   Social Connections: Not on file  Intimate Partner Violence: Not on file    Outpatient Medications Prior to Visit  Medication Sig Dispense Refill   ALTAVERA 0.15-30 MG-MCG tablet Take 1 tablet by mouth daily.     FLUoxetine (PROZAC) 40 MG capsule TAKE 1 CAPSULE BY MOUTH EVERY DAY 90 capsule 3   No facility-administered medications prior to visit.    No Known Allergies  Review of Systems  Constitutional:  Negative for chills.  HENT: Negative.    Respiratory: Negative.    Gastrointestinal:  Negative for abdominal distention, abdominal pain, nausea and vomiting.  Genitourinary:  Positive for dysuria and frequency. Negative for flank pain, vaginal discharge and vaginal pain.  All other systems reviewed and are negative.     Objective:    Physical Exam Vitals and nursing note reviewed.  Constitutional:      Appearance: Normal appearance.  HENT:     Head: Normocephalic.     Right Ear: External ear normal.     Left Ear: External ear normal.     Nose: Nose normal.     Mouth/Throat:     Mouth: Mucous membranes are moist.     Pharynx: Oropharynx is clear.  Eyes:     Conjunctiva/sclera: Conjunctivae normal.  Cardiovascular:  Rate and Rhythm: Normal rate and regular rhythm.     Pulses: Normal pulses.     Heart sounds: Normal heart sounds.  Pulmonary:     Effort: Pulmonary effort is normal.     Breath sounds: Normal breath sounds.  Abdominal:     General: Bowel sounds are normal. There is no distension.     Palpations: There is no mass.     Tenderness: There is no abdominal tenderness. There is no right CVA tenderness or left CVA tenderness.  Neurological:     Mental Status: She is alert and oriented to person, place, and time.  Psychiatric:        Behavior: Behavior normal.    BP 135/83    Pulse 96    Ht 5\' 7"  (1.702 m)    Wt 207 lb (93.9 kg)    SpO2 100%    BMI 32.42 kg/m  Wt Readings from Last 3 Encounters:  03/16/21 207 lb (93.9 kg)  03/05/18 183 lb 12.8 oz  (83.4 kg)  01/01/18 187 lb 9.6 oz (85.1 kg)    Health Maintenance Due  Topic Date Due   Hepatitis C Screening  Never done   PAP SMEAR-Modifier  12/17/2016   COVID-19 Vaccine (2 - Moderna risk series) 11/06/2019   INFLUENZA VACCINE  09/21/2020    There are no preventive care reminders to display for this patient.   No results found for: TSH Lab Results  Component Value Date   WBC 13.9 (H) 11/07/2013   HGB 11.0 (L) 11/07/2013   HCT 32.9 (L) 11/07/2013   MCV 92.7 11/07/2013   PLT 185 11/07/2013   Lab Results  Component Value Date   NA 136 (L) 10/18/2013   K 4.2 10/18/2013   CO2 21 10/18/2013   GLUCOSE 70 10/18/2013   BUN 6 10/18/2013   CREATININE 0.61 10/18/2013   BILITOT 0.3 10/18/2013   ALKPHOS 115 10/18/2013   AST 14 10/18/2013   ALT 10 10/18/2013   PROT 6.0 10/18/2013   ALBUMIN 2.9 (L) 10/18/2013   CALCIUM 9.1 10/18/2013   ANIONGAP 14 10/18/2013       Assessment & Plan:    OBJECTIVE: Appears well, in no apparent distress.  Vital signs are normal. The abdomen is soft without tenderness, guarding, mass, rebound or organomegaly. No CVA tenderness or inguinal adenopathy noted. Urine dipstick shows positive for RBC's, positive for nitrates, and positive for leukocytes.  Micro exam: many+ bacteria.    UTI uncomplicated without evidence of pyelonephritis   Treatment per orders - also push fluids, may use Pyridium OTC prn. Call or return to clinic prn if these symptoms worsen or fail to improve as anticipated.   Macrobid 100 mg tablet by mouth twice daily. Cultures completed results pending.  Problem List Items Addressed This Visit   None Visit Diagnoses     Dysuria    -  Primary   Relevant Medications   phenazopyridine (PYRIDIUM) 95 MG tablet   nitrofurantoin, macrocrystal-monohydrate, (MACROBID) 100 MG capsule   Other Relevant Orders   CULTURE, URINE COMPREHENSIVE   Urinalysis, Routine w reflex microscopic (Completed)   Microscopic Examination (Completed)         Meds ordered this encounter  Medications   phenazopyridine (PYRIDIUM) 95 MG tablet    Sig: Take 1 tablet (95 mg total) by mouth 3 (three) times daily as needed for pain.    Dispense:  10 tablet    Refill:  0    Order Specific Question:  Supervising Provider    Answer:   Claretta Fraise [phone]   nitrofurantoin, macrocrystal-monohydrate, (MACROBID) 100 MG capsule    Sig: Take 1 capsule (100 mg total) by mouth 2 (two) times daily. 1 po BId    Dispense:  14 capsule    Refill:  0    Order Specific Question:   Supervising Provider    Answer:   Claretta Fraise [[id number]]     Ivy Lynn, NP

## 2021-03-16 NOTE — Patient Instructions (Signed)
Dysuria ?Dysuria is pain or discomfort during urination. The pain or discomfort may be felt in the part of the body that drains urine from the bladder (urethra) or in the surrounding tissue of the genitals. The pain may also be felt in the groin area, lower abdomen, or lower back. ?You may have to urinate frequently or have the sudden feeling that you have to urinate (urgency). Dysuria can affect anyone, but it is more common in females. Dysuria can be caused by many different things, including: ?Urinary tract infection. ?Kidney stones or bladder stones. ?Certain STIs (sexually transmitted infections), such as chlamydia. ?Dehydration. ?Inflammation of the tissues of the vagina. ?Use of certain medicines. ?Use of certain soaps or scented products that cause irritation. ?Follow these instructions at home: ?Medicines ?Take over-the-counter and prescription medicines only as told by your health care provider. ?If you were prescribed an antibiotic medicine, take it as told by your health care provider. Do not stop taking the antibiotic even if you start to feel better. ?Eating and drinking ? ?Drink enough fluid to keep your urine pale yellow. ?Avoid caffeinated beverages, tea, and alcohol. These beverages can irritate the bladder and make dysuria worse. In males, alcohol may irritate the prostate. ?General instructions ?Watch your condition for any changes. ?Urinate often. Avoid holding urine for long periods of time. ?If you are female, you should wipe from front to back after urinating or having a bowel movement. Use each piece of toilet paper only once. ?Empty your bladder after sex. ?Keep all follow-up visits. This is important. ?If you had any tests done to find the cause of dysuria, it is up to you to get your test results. Ask your health care provider, or the department that is doing the test, when your results will be ready. ?Contact a health care provider if: ?You have a fever. ?You develop pain in your back or  sides. ?You have nausea or vomiting. ?You have blood in your urine. ?You are not urinating as often as you usually do. ?Get help right away if: ?Your pain is severe and not relieved with medicines. ?You cannot eat or drink without vomiting. ?You are confused. ?You have a rapid heartbeat while resting. ?You have shaking or chills. ?You feel extremely weak. ?Summary ?Dysuria is pain or discomfort while urinating. Many different conditions can lead to dysuria. ?If you have dysuria, you may have to urinate frequently or have the sudden feeling that you have to urinate (urgency). ?Watch your condition for any changes. Keep all follow-up visits. ?Make sure that you urinate often and drink enough fluid to keep your urine pale yellow. ?This information is not intended to replace advice given to you by your health care provider. Make sure you discuss any questions you have with your health care provider. ?Document Revised: 09/20/2019 Document Reviewed: 09/20/2019 ?Elsevier Patient Education ? 2022 [employer] Inc[employer] ? ?

## 2021-03-19 LAB — CULTURE, URINE COMPREHENSIVE

## 2021-04-01 ENCOUNTER — Other Ambulatory Visit (HOSPITAL_COMMUNITY): Payer: Self-pay

## 2021-04-01 MED ORDER — FLUOXETINE HCL 40 MG PO CAPS
ORAL_CAPSULE | ORAL | 4 refills | Status: DC
  Filled 2021-04-01: qty 90, 90d supply, fill #0
  Filled 2021-08-10: qty 90, 90d supply, fill #1
  Filled 2021-11-04: qty 90, 90d supply, fill #2
  Filled 2022-02-02: qty 90, 90d supply, fill #3

## 2021-08-10 ENCOUNTER — Other Ambulatory Visit (HOSPITAL_COMMUNITY): Payer: Self-pay

## 2021-08-11 ENCOUNTER — Other Ambulatory Visit (HOSPITAL_COMMUNITY): Payer: Self-pay

## 2021-10-01 ENCOUNTER — Encounter: Payer: Commercial Managed Care - PPO | Admitting: Nurse Practitioner

## 2021-11-04 ENCOUNTER — Other Ambulatory Visit (HOSPITAL_COMMUNITY): Payer: Self-pay

## 2022-02-02 ENCOUNTER — Other Ambulatory Visit (HOSPITAL_COMMUNITY): Payer: Self-pay

## 2022-03-10 ENCOUNTER — Encounter: Payer: Self-pay | Admitting: Family Medicine

## 2022-03-10 ENCOUNTER — Ambulatory Visit: Payer: Commercial Managed Care - PPO | Admitting: Family Medicine

## 2022-03-10 DIAGNOSIS — D509 Iron deficiency anemia, unspecified: Secondary | ICD-10-CM | POA: Diagnosis not present

## 2022-03-10 DIAGNOSIS — F339 Major depressive disorder, recurrent, unspecified: Secondary | ICD-10-CM | POA: Insufficient documentation

## 2022-03-10 DIAGNOSIS — F411 Generalized anxiety disorder: Secondary | ICD-10-CM | POA: Diagnosis not present

## 2022-03-10 DIAGNOSIS — R5383 Other fatigue: Secondary | ICD-10-CM

## 2022-03-10 MED ORDER — FLUOXETINE HCL 40 MG PO CAPS
40.0000 mg | ORAL_CAPSULE | Freq: Every day | ORAL | 3 refills | Status: DC
  Filled 2022-03-10: qty 90, fill #0
  Filled 2022-03-16 – 2022-05-21 (×2): qty 90, 90d supply, fill #0
  Filled 2022-08-16 – 2022-08-21 (×2): qty 90, 90d supply, fill #1
  Filled 2022-11-15: qty 90, 90d supply, fill #2

## 2022-03-10 NOTE — Progress Notes (Signed)
Established Patient Office Visit  Subjective   Patient ID: Julia Burton, female    DOB: 11/03/1980  Age: 42 y.o. MRN: [id number]  Chief Complaint  Patient presents with   Depression   Medical Management of Chronic Issues   Fatigue    HPI She has a history of recurrent depression and anxiety. She has been on Prozac since she was a teenager. She feels like her anxiety and depression is well managed overall.   She does report fatigue and decreased energy. She sleeps well but doesn't wake up feeling rested. She also reports low libdo. Denies shortness of breath, changes in appetite, changes in cycles, bowel habits, mood, tremor, palpations, or edema. She has gained weight over time. She does report heat intolerance.      03/10/2022    4:39 PM 03/16/2021    4:29 PM 02/04/2019    2:00 PM  Depression screen PHQ 2/9  Decreased Interest 1 1 0  Down, Depressed, Hopeless 1 1 0  PHQ - 2 Score 2 2 0  Altered sleeping 2 2   Tired, decreased energy 2 2   Change in appetite 2 1   Feeling bad or failure about yourself  1 1   Trouble concentrating 0 0   Moving slowly or fidgety/restless 0 0   Suicidal thoughts 0 0   PHQ-9 Score 9 8   Difficult doing work/chores Somewhat difficult        03/10/2022    4:40 PM 03/16/2021    4:30 PM 02/04/2019    1:53 PM 01/01/2018    9:03 AM  GAD 7 : Generalized Anxiety Score  Nervous, Anxious, on Edge '2 2 1 3  '$ Control/stop worrying '2 1 1 3  '$ Worry too much - different things '2 1 1 3  '$ Trouble relaxing '2 1 1 3  '$ Restless 0 0 0 0  Easily annoyed or irritable '2 2 2 3  '$ Afraid - awful might happen 0 0 0 1  Total GAD 7 Score '10 7 6 16  '$ Anxiety Difficulty Somewhat difficult  Somewhat difficult Somewhat difficult       ROS As per HPI.    Objective:     BP 135/81   Pulse 86   Temp 98.1 F (36.7 C) (Temporal)   Ht '5\' 7"'$  (1.702 m)   Wt 209 lb 8 oz (95 kg)   SpO2 98%   BMI 32.81 kg/m  Wt Readings from Last 3 Encounters:  03/10/22 209 lb 8 oz  (95 kg)  03/16/21 207 lb (93.9 kg)  03/05/18 183 lb 12.8 oz (83.4 kg)      Physical Exam Vitals and nursing note reviewed.  Constitutional:      General: She is not in acute distress.    Appearance: She is not ill-appearing, toxic-appearing or diaphoretic.  HENT:     Head: Normocephalic and atraumatic.  Neck:     Thyroid: No thyroid mass, thyromegaly or thyroid tenderness.  Cardiovascular:     Rate and Rhythm: Regular rhythm.     Heart sounds: Normal heart sounds. No murmur heard. Pulmonary:     Effort: Pulmonary effort is normal. No respiratory distress.     Breath sounds: Normal breath sounds.  Abdominal:     General: Bowel sounds are normal. There is no distension.     Palpations: Abdomen is soft.     Tenderness: There is no abdominal tenderness. There is no guarding or rebound.  Musculoskeletal:     Cervical back: Neck  supple. No rigidity.     Right lower leg: No edema.     Left lower leg: No edema.  Neurological:     General: No focal deficit present.     Mental Status: She is alert and oriented to person, place, and time.  Psychiatric:        Mood and Affect: Mood normal.        Behavior: Behavior normal.        Thought Content: Thought content normal.        Judgment: Judgment normal.      No results found for any visits on 03/10/22.    The ASCVD Risk score (Arnett DK, et al., 2019) failed to calculate for the following reasons:   Cannot find a previous HDL lab   Cannot find a previous total cholesterol lab    Assessment & Plan:   Dakiyah was seen today for depression, medical management of chronic issues and fatigue.  Diagnoses and all orders for this visit:  Other fatigue Discussed could be due to anxiety/depression. Will check labs as below to rule out out causes. If labs are unremarkable, discussed increasing prozac.  -     Anemia Profile B -     CMP14+EGFR -     TSH -     T4, Free  Iron deficiency anemia, unspecified iron deficiency anemia  type Not currently on iron supplement.  -     Anemia Profile B  Depression, recurrent ([place]) GAD (generalized anxiety disorder) Fair control. She reports that she feels like she can manage her symptoms well. Continue prozac.  -     FLUoxetine (PROZAC) 40 MG capsule; Take 1 capsule (40 mg total) by mouth daily.  Return in about 3 months (around 06/09/2022) for CPE.   The patient indicates understanding of these issues and agrees with the plan.  Gwenlyn Perking, FNP

## 2022-03-11 ENCOUNTER — Other Ambulatory Visit: Payer: Self-pay | Admitting: *Deleted

## 2022-03-11 ENCOUNTER — Other Ambulatory Visit: Payer: Self-pay

## 2022-03-11 ENCOUNTER — Other Ambulatory Visit (HOSPITAL_COMMUNITY): Payer: Self-pay

## 2022-03-11 ENCOUNTER — Encounter: Payer: Self-pay | Admitting: Family Medicine

## 2022-03-11 DIAGNOSIS — D72828 Other elevated white blood cell count: Secondary | ICD-10-CM

## 2022-03-11 DIAGNOSIS — Z1231 Encounter for screening mammogram for malignant neoplasm of breast: Secondary | ICD-10-CM | POA: Diagnosis not present

## 2022-03-11 DIAGNOSIS — N951 Menopausal and female climacteric states: Secondary | ICD-10-CM | POA: Diagnosis not present

## 2022-03-11 DIAGNOSIS — Z01419 Encounter for gynecological examination (general) (routine) without abnormal findings: Secondary | ICD-10-CM | POA: Diagnosis not present

## 2022-03-11 DIAGNOSIS — Z1151 Encounter for screening for human papillomavirus (HPV): Secondary | ICD-10-CM | POA: Diagnosis not present

## 2022-03-11 DIAGNOSIS — Z124 Encounter for screening for malignant neoplasm of cervix: Secondary | ICD-10-CM | POA: Diagnosis not present

## 2022-03-11 DIAGNOSIS — Z3040 Encounter for surveillance of contraceptives, unspecified: Secondary | ICD-10-CM | POA: Diagnosis not present

## 2022-03-11 DIAGNOSIS — Z6831 Body mass index (BMI) 31.0-31.9, adult: Secondary | ICD-10-CM | POA: Diagnosis not present

## 2022-03-11 LAB — CMP14+EGFR
ALT: 10 IU/L (ref 0–32)
AST: 11 IU/L (ref 0–40)
Albumin/Globulin Ratio: 1.5 (ref 1.2–2.2)
Albumin: 4.3 g/dL (ref 3.9–4.9)
Alkaline Phosphatase: 80 IU/L (ref 44–121)
BUN/Creatinine Ratio: 17 (ref 9–23)
BUN: 16 mg/dL (ref 6–24)
Bilirubin Total: <0.2 mg/dL (ref 0.0–1.2)
CO2: 24 mmol/L (ref 20–29)
Calcium: 9.6 mg/dL (ref 8.7–10.2)
Chloride: 100 mmol/L (ref 96–106)
Creatinine, Ser: 0.93 mg/dL (ref 0.57–1.00)
Globulin, Total: 2.9 g/dL (ref 1.5–4.5)
Glucose: 76 mg/dL (ref 70–99)
Potassium: 4.2 mmol/L (ref 3.5–5.2)
Sodium: 139 mmol/L (ref 134–144)
Total Protein: 7.2 g/dL (ref 6.0–8.5)
eGFR: 80 mL/min/{1.73_m2} (ref >59)

## 2022-03-11 LAB — ANEMIA PROFILE B
Basophils Absolute: 0.1 10*3/uL (ref 0.0–0.2)
Basos: 1 %
EOS (ABSOLUTE): 0.1 10*3/uL (ref 0.0–0.4)
Eos: 1 %
Ferritin: 152 ng/mL — ABNORMAL HIGH (ref 15–150)
Folate: 4.9 ng/mL (ref >3.0)
Hematocrit: 38.3 % (ref 34.0–46.6)
Hemoglobin: 12.6 g/dL (ref 11.1–15.9)
Immature Grans (Abs): 0 10*3/uL (ref 0.0–0.1)
Immature Granulocytes: 0 %
Iron Saturation: 23 % (ref 15–55)
Iron: 71 ug/dL (ref 27–159)
Lymphocytes Absolute: 4.5 10*3/uL — ABNORMAL HIGH (ref 0.7–3.1)
Lymphs: 32 %
MCH: 29.8 pg (ref 26.6–33.0)
MCHC: 32.9 g/dL (ref 31.5–35.7)
MCV: 91 fL (ref 79–97)
Monocytes Absolute: 1 10*3/uL — ABNORMAL HIGH (ref 0.1–0.9)
Monocytes: 7 %
Neutrophils Absolute: 8.2 10*3/uL — ABNORMAL HIGH (ref 1.4–7.0)
Neutrophils: 59 %
Platelets: 351 10*3/uL (ref 150–450)
RBC: 4.23 x10E6/uL (ref 3.77–5.28)
RDW: 12.1 % (ref 11.7–15.4)
Retic Ct Pct: 1.5 % (ref 0.6–2.6)
Total Iron Binding Capacity: 314 ug/dL (ref 250–450)
UIBC: 243 ug/dL (ref 131–425)
Vitamin B-12: 373 pg/mL (ref 232–1245)
WBC: 14 10*3/uL — ABNORMAL HIGH (ref 3.4–10.8)

## 2022-03-11 LAB — TSH: TSH: 2.69 u[IU]/mL (ref 0.450–4.500)

## 2022-03-11 LAB — T4, FREE: Free T4: 1.16 ng/dL (ref 0.82–1.77)

## 2022-03-11 MED ORDER — FLUOXETINE HCL 20 MG PO CAPS
20.0000 mg | ORAL_CAPSULE | Freq: Every day | ORAL | 1 refills | Status: DC
  Filled 2022-03-11: qty 90, 90d supply, fill #0
  Filled 2022-05-21 – 2022-06-08 (×2): qty 90, 90d supply, fill #1

## 2022-03-15 ENCOUNTER — Other Ambulatory Visit: Payer: Self-pay

## 2022-03-16 ENCOUNTER — Other Ambulatory Visit (HOSPITAL_COMMUNITY): Payer: Self-pay

## 2022-03-16 ENCOUNTER — Other Ambulatory Visit: Payer: Self-pay | Admitting: Obstetrics and Gynecology

## 2022-03-16 DIAGNOSIS — R928 Other abnormal and inconclusive findings on diagnostic imaging of breast: Secondary | ICD-10-CM

## 2022-03-17 ENCOUNTER — Other Ambulatory Visit: Payer: Commercial Managed Care - PPO

## 2022-03-17 DIAGNOSIS — D72828 Other elevated white blood cell count: Secondary | ICD-10-CM

## 2022-03-18 LAB — CBC WITH DIFFERENTIAL/PLATELET
Basophils Absolute: 0.1 10*3/uL (ref 0.0–0.2)
Basos: 1 %
EOS (ABSOLUTE): 0 10*3/uL (ref 0.0–0.4)
Eos: 0 %
Hematocrit: 38.2 % (ref 34.0–46.6)
Hemoglobin: 12.7 g/dL (ref 11.1–15.9)
Immature Grans (Abs): 0 10*3/uL (ref 0.0–0.1)
Immature Granulocytes: 0 %
Lymphocytes Absolute: 4.2 10*3/uL — ABNORMAL HIGH (ref 0.7–3.1)
Lymphs: 36 %
MCH: 30.2 pg (ref 26.6–33.0)
MCHC: 33.2 g/dL (ref 31.5–35.7)
MCV: 91 fL (ref 79–97)
Monocytes Absolute: 0.8 10*3/uL (ref 0.1–0.9)
Monocytes: 7 %
Neutrophils Absolute: 6.7 10*3/uL (ref 1.4–7.0)
Neutrophils: 56 %
Platelets: 330 10*3/uL (ref 150–450)
RBC: 4.2 x10E6/uL (ref 3.77–5.28)
RDW: 12.1 % (ref 11.7–15.4)
WBC: 11.9 10*3/uL — ABNORMAL HIGH (ref 3.4–10.8)

## 2022-03-22 ENCOUNTER — Ambulatory Visit
Admission: RE | Admit: 2022-03-22 | Discharge: 2022-03-22 | Disposition: A | Discharge status: Home / Self Care | Payer: Commercial Managed Care - PPO | Source: Ambulatory Visit | Attending: Obstetrics and Gynecology | Admitting: Obstetrics and Gynecology

## 2022-03-22 DIAGNOSIS — N6011 Diffuse cystic mastopathy of right breast: Secondary | ICD-10-CM | POA: Diagnosis not present

## 2022-03-22 DIAGNOSIS — R928 Other abnormal and inconclusive findings on diagnostic imaging of breast: Secondary | ICD-10-CM

## 2022-04-15 ENCOUNTER — Ambulatory Visit: Payer: Commercial Managed Care - PPO | Admitting: Family Medicine

## 2022-04-15 ENCOUNTER — Encounter: Payer: Self-pay | Admitting: Family Medicine

## 2022-04-15 VITALS — BP 128/81 | HR 81 | Temp 97.6°F | Resp 20 | Ht 67.0 in | Wt 209.0 lb

## 2022-04-15 DIAGNOSIS — F339 Major depressive disorder, recurrent, unspecified: Secondary | ICD-10-CM

## 2022-04-15 DIAGNOSIS — F411 Generalized anxiety disorder: Secondary | ICD-10-CM

## 2022-04-15 NOTE — Progress Notes (Signed)
Established Patient Office Visit  Subjective   Patient ID: Julia Burton, female    DOB: 18-Jan-1981  Age: 43 y.o. MRN: [id number]  Chief Complaint  Patient presents with   Medical Management of Chronic Issues    HPI Julia Burton reports that she is feeling much better since increasing her prozac. She denies side effects.      04/15/2022    1:03 PM 03/10/2022    4:39 PM 03/16/2021    4:29 PM  Depression screen PHQ 2/9  Decreased Interest '1 1 1  '$ Down, Depressed, Hopeless '1 1 1  '$ PHQ - 2 Score '2 2 2  '$ Altered sleeping 0 2 2  Tired, decreased energy '1 2 2  '$ Change in appetite 0 2 1  Feeling bad or failure about yourself  '1 1 1  '$ Trouble concentrating 0 0 0  Moving slowly or fidgety/restless 0 0 0  Suicidal thoughts 0 0 0  PHQ-9 Score '4 9 8  '$ Difficult doing work/chores Somewhat difficult Somewhat difficult       04/15/2022    1:04 PM 03/10/2022    4:40 PM 03/16/2021    4:30 PM 02/04/2019    1:53 PM  GAD 7 : Generalized Anxiety Score  Nervous, Anxious, on Edge '1 2 2 1  '$ Control/stop worrying '1 2 1 1  '$ Worry too much - different things '1 2 1 1  '$ Trouble relaxing 0 '2 1 1  '$ Restless 0 0 0 0  Easily annoyed or irritable '1 2 2 2  '$ Afraid - awful might happen 0 0 0 0  Total GAD 7 Score '4 10 7 6  '$ Anxiety Difficulty Somewhat difficult Somewhat difficult  Somewhat difficult     Past Medical History:  Diagnosis Date   Anemia    Anxiety    Dysplastic nevus 09/07/2010   mild, right post shoulder   History of kidney stones    Medical history non-contributory       ROS As per HPI.   Objective:     BP (!) 146/97   Pulse 81   Temp 97.6 F (36.4 C) (Oral)   Resp 20   Ht '5\' 7"'$  (1.702 m)   Wt 209 lb (94.8 kg)   SpO2 97%   BMI 32.73 kg/m    Physical Exam Vitals and nursing note reviewed.  Constitutional:      General: She is not in acute distress.    Appearance: She is not ill-appearing, toxic-appearing or diaphoretic.  Cardiovascular:     Rate and Rhythm: Normal rate  and regular rhythm.     Heart sounds: Normal heart sounds. No murmur heard. Pulmonary:     Effort: Pulmonary effort is normal. No respiratory distress.     Breath sounds: Normal breath sounds.  Musculoskeletal:     Right lower leg: No edema.     Left lower leg: No edema.  Skin:    General: Skin is warm and dry.  Neurological:     General: No focal deficit present.     Mental Status: She is alert and oriented to person, place, and time.  Psychiatric:        Mood and Affect: Mood normal.        Behavior: Behavior normal.      No results found for any visits on 04/15/22.    The ASCVD Risk score (Arnett DK, et al., 2019) failed to calculate for the following reasons:   Cannot find a previous HDL lab   Cannot find a  previous total cholesterol lab    Assessment & Plan:   Julia Burton was seen today for medical management of chronic issues.  Diagnoses and all orders for this visit:  Depression, recurrent ([place]) GAD (generalized anxiety disorder) Well controlled on current regimen.   Keep scheduled follow up for CPE.   The patient indicates understanding of these issues and agrees with the plan.   Gwenlyn Perking, FNP

## 2022-05-21 ENCOUNTER — Other Ambulatory Visit (HOSPITAL_COMMUNITY): Payer: Self-pay

## 2022-05-23 ENCOUNTER — Other Ambulatory Visit: Payer: Self-pay

## 2022-06-08 ENCOUNTER — Other Ambulatory Visit: Payer: Self-pay

## 2022-06-10 ENCOUNTER — Other Ambulatory Visit (HOSPITAL_COMMUNITY): Payer: Self-pay

## 2022-06-10 ENCOUNTER — Ambulatory Visit (INDEPENDENT_AMBULATORY_CARE_PROVIDER_SITE_OTHER): Payer: Commercial Managed Care - PPO | Admitting: Family Medicine

## 2022-06-10 ENCOUNTER — Encounter: Payer: Self-pay | Admitting: Family Medicine

## 2022-06-10 ENCOUNTER — Other Ambulatory Visit: Payer: Self-pay

## 2022-06-10 VITALS — BP 132/80 | HR 87 | Temp 98.3°F | Ht 67.0 in | Wt 206.1 lb

## 2022-06-10 DIAGNOSIS — Z0001 Encounter for general adult medical examination with abnormal findings: Secondary | ICD-10-CM | POA: Diagnosis not present

## 2022-06-10 DIAGNOSIS — F339 Major depressive disorder, recurrent, unspecified: Secondary | ICD-10-CM

## 2022-06-10 DIAGNOSIS — F411 Generalized anxiety disorder: Secondary | ICD-10-CM

## 2022-06-10 DIAGNOSIS — Z130 Encounter for screening for diseases of the blood and blood-forming organs and certain disorders involving the immune mechanism: Secondary | ICD-10-CM | POA: Diagnosis not present

## 2022-06-10 DIAGNOSIS — Z0000 Encounter for general adult medical examination without abnormal findings: Secondary | ICD-10-CM

## 2022-06-10 DIAGNOSIS — Z13220 Encounter for screening for lipoid disorders: Secondary | ICD-10-CM

## 2022-06-10 DIAGNOSIS — Z13228 Encounter for screening for other metabolic disorders: Secondary | ICD-10-CM | POA: Diagnosis not present

## 2022-06-10 DIAGNOSIS — Z1329 Encounter for screening for other suspected endocrine disorder: Secondary | ICD-10-CM | POA: Diagnosis not present

## 2022-06-10 MED ORDER — FLUOXETINE HCL 20 MG PO CAPS
20.0000 mg | ORAL_CAPSULE | Freq: Every day | ORAL | 1 refills | Status: DC
  Filled 2022-06-10 – 2022-09-06 (×3): qty 90, 90d supply, fill #0
  Filled 2022-12-05: qty 90, 90d supply, fill #1

## 2022-06-10 NOTE — Progress Notes (Signed)
Complete physical exam  Patient: Julia Burton   DOB: September 01, 1980   42 y.o. Female  MRN: [id number]  Subjective:    Chief Complaint  Patient presents with   Annual Exam    Julia Burton is a 42 y.o. female who presents today for a complete physical exam. She reports consuming a general diet.  She tries to exercise twice a week.  She generally feels well. She reports sleeping well. She does not have additional problems to discuss today.   She is currently taking prozac 60 mg daily and feels like this is well controlled.   Most recent fall risk assessment:    06/10/2022   10:04 AM  Fall Risk   Falls in the past year? 0     Most recent depression screenings:    06/10/2022   10:04 AM 04/15/2022    1:03 PM 03/10/2022    4:39 PM  Depression screen PHQ 2/9  Decreased Interest Down, Depressed, Hopeless PHQ - 2 Score Altered sleeping 1 0 2  Tired, decreased energy Change in appetite 1 0 2  Feeling bad or failure about yourself  Trouble concentrating 0 0 0  Moving slowly or fidgety/restless 0 0 0  Suicidal thoughts 0 0 0  PHQ-9 Score Difficult doing work/chores Somewhat difficult Somewhat difficult Somewhat difficult      06/10/2022   10:05 AM 04/15/2022    1:04 PM 03/10/2022    4:40 PM 03/16/2021    4:30 PM  GAD 7 : Generalized Anxiety Score  Nervous, Anxious, on Edge Control/stop worrying Worry too much - different things 0 Trouble relaxing 1 0 2 1  Restless 0 0 0 0  Easily annoyed or irritable Afraid - awful might happen 1 0 0 0  Total GAD 7 Score Anxiety Difficulty Not difficult at all Somewhat difficult Somewhat difficult       Vision:Within last year and Dental: No current dental problems and Receives regular dental care  Past Medical History:  Diagnosis Date   Anemia    Anxiety    Dysplastic nevus 09/07/2010   mild, right post shoulder   History of kidney stones     Medical history non-contributory       Patient Care Team: Gabriel Earing, FNP as PCP - General (Family Medicine) Glyn Ade, PA-C as Physician Assistant (Dermatology)   Outpatient Medications Prior to Visit  Medication Sig   ALTAVERA 0.15-30 MG-MCG tablet Take 1 tablet by mouth daily.   FLUoxetine (PROZAC) 20 MG capsule Take 1 capsule (20 mg total) by mouth daily.   FLUoxetine (PROZAC) 40 MG capsule Take 1 capsule (40 mg total) by mouth daily.   No facility-administered medications prior to visit.    ROS Negative unless specially indicated above in HPI.     Objective:     BP 132/80   Pulse 87   Temp 98.3 F (36.8 C) (Temporal)   Ht  (1.702 m)   Wt 206 lb 2 oz (93.5 kg)   SpO2 97%   BMI 32.28 kg/m    Physical Exam Vitals and nursing note reviewed.  Constitutional:      General: She is not in acute distress.    Appearance:  Normal appearance. She is not ill-appearing.  HENT:     Head: Normocephalic.     Right Ear: Tympanic membrane, ear canal and external ear normal.     Left Ear: Tympanic membrane, ear canal and external ear normal.     Nose: Nose normal.     Mouth/Throat:     Mouth: Mucous membranes are moist.     Pharynx: Oropharynx is clear.  Eyes:     Extraocular Movements: Extraocular movements intact.     Conjunctiva/sclera: Conjunctivae normal.     Pupils: Pupils are equal, round, and reactive to light.  Neck:     Thyroid: No thyroid mass, thyromegaly or thyroid tenderness.     Vascular: No carotid bruit.  Cardiovascular:     Rate and Rhythm: Normal rate and regular rhythm.     Pulses: Normal pulses.     Heart sounds: Normal heart sounds. No murmur heard.    No friction rub. No gallop.  Pulmonary:     Effort: Pulmonary effort is normal.     Breath sounds: Normal breath sounds.  Abdominal:     General: Bowel sounds are normal. There is no distension.     Palpations: Abdomen is soft. There is no mass.     Tenderness: There is no  abdominal tenderness. There is no guarding.  Musculoskeletal:        General: No swelling. Normal range of motion.     Cervical back: Neck supple. No rigidity or tenderness.     Right lower leg: No edema.     Left lower leg: No edema.  Skin:    General: Skin is warm and dry.     Capillary Refill: Capillary refill takes less than 2 seconds.     Findings: No lesion or rash.  Neurological:     General: No focal deficit present.     Mental Status: She is alert and oriented to person, place, and time.     Cranial Nerves: No cranial nerve deficit.     Motor: No weakness.     Gait: Gait normal.  Psychiatric:        Mood and Affect: Mood normal.        Behavior: Behavior normal.        Thought Content: Thought content normal.        Judgment: Judgment normal.      No results found for any visits on 06/10/22.     Assessment & Plan:    Routine Health Maintenance and Physical Exam  Amerika was seen today for annual exam.  Diagnoses and all orders for this visit:  Routine general medical examination at a health care facility  Depression, recurrent GAD (generalized anxiety disorder) Well controlled on current regimen.  -     FLUoxetine (PROZAC) 20 MG capsule; Take 1 capsule (20 mg total) by mouth daily. Take with 40 mg tablet daily for total of 60 mg -     Vitamin D, 25-hydroxy  Screening for endocrine, metabolic and immunity disorder -     CBC with Differential/Platelet -     CMP14+EGFR  Encounter for screening for lipid disorder Fasting labs pending.  -     Lipid panel    Immunization History  Administered Date(s) Administered   Influenza,inj,Quad PF,6+ Mos 11/08/2013   Influenza-Unspecified 11/23/2017, 11/22/2018, 11/28/2019   MMR 09/05/2014, 10/06/2014   Moderna Sars-Covid-2 Vaccination 10/09/2019   PPD Test 12/29/2014, 02/17/2015   Td 10/25/2016    Health Maintenance  Topic Date Due  COVID-19 Vaccine (2 - Moderna risk series) 03/27/2023 (Originally  11/06/2019)   PAP SMEAR-Modifier  06/10/2023 (Originally 12/17/2016)   Hepatitis C Screening  06/10/2023 (Originally 11/05/1999)   INFLUENZA VACCINE  09/22/2022   DTaP/Tdap/Td (2 - Tdap) 10/26/2026   HIV Screening  Completed   HPV VACCINES  Aged Out    Discussed health benefits of physical activity, and encouraged her to engage in regular exercise appropriate for her age and condition.  Problem List Items Addressed This Visit       Other   Depression, recurrent   Relevant Medications   FLUoxetine (PROZAC) 20 MG capsule   Other Relevant Orders   Vitamin D, 25-hydroxy   GAD (generalized anxiety disorder)   Relevant Medications   FLUoxetine (PROZAC) 20 MG capsule   Other Relevant Orders   Vitamin D, 25-hydroxy   Other Visit Diagnoses     Routine general medical examination at a health care facility    -  Primary   Screening for endocrine, metabolic and immunity disorder       Relevant Orders   CBC with Differential/Platelet   CMP14+EGFR   Encounter for screening for lipid disorder       Relevant Orders   Lipid panel      Return in about 6 months (around 12/10/2022) for medication follow up.   The patient indicates understanding of these issues and agrees with the plan.  Gabriel Earing, FNP

## 2022-06-11 LAB — CBC WITH DIFFERENTIAL/PLATELET
Basophils Absolute: 0.1 10*3/uL (ref 0.0–0.2)
Basos: 1 %
EOS (ABSOLUTE): 0.1 10*3/uL (ref 0.0–0.4)
Eos: 1 %
Hematocrit: 39.1 % (ref 34.0–46.6)
Hemoglobin: 13.1 g/dL (ref 11.1–15.9)
Immature Grans (Abs): 0 10*3/uL (ref 0.0–0.1)
Immature Granulocytes: 0 %
Lymphocytes Absolute: 3.6 10*3/uL — ABNORMAL HIGH (ref 0.7–3.1)
Lymphs: 41 %
MCH: 30.4 pg (ref 26.6–33.0)
MCHC: 33.5 g/dL (ref 31.5–35.7)
MCV: 91 fL (ref 79–97)
Monocytes Absolute: 0.5 10*3/uL (ref 0.1–0.9)
Monocytes: 6 %
Neutrophils Absolute: 4.5 10*3/uL (ref 1.4–7.0)
Neutrophils: 51 %
Platelets: 325 10*3/uL (ref 150–450)
RBC: 4.31 x10E6/uL (ref 3.77–5.28)
RDW: 11.9 % (ref 11.7–15.4)
WBC: 8.8 10*3/uL (ref 3.4–10.8)

## 2022-06-11 LAB — CMP14+EGFR
ALT: 9 IU/L (ref 0–32)
AST: 12 IU/L (ref 0–40)
Albumin/Globulin Ratio: 1.7 (ref 1.2–2.2)
Albumin: 4.2 g/dL (ref 3.9–4.9)
Alkaline Phosphatase: 71 IU/L (ref 44–121)
BUN/Creatinine Ratio: 11 (ref 9–23)
BUN: 10 mg/dL (ref 6–24)
Bilirubin Total: 0.3 mg/dL (ref 0.0–1.2)
CO2: 20 mmol/L (ref 20–29)
Calcium: 9.1 mg/dL (ref 8.7–10.2)
Chloride: 104 mmol/L (ref 96–106)
Creatinine, Ser: 0.94 mg/dL (ref 0.57–1.00)
Globulin, Total: 2.5 g/dL (ref 1.5–4.5)
Glucose: 96 mg/dL (ref 70–99)
Potassium: 4.5 mmol/L (ref 3.5–5.2)
Sodium: 139 mmol/L (ref 134–144)
Total Protein: 6.7 g/dL (ref 6.0–8.5)
eGFR: 79 mL/min/{1.73_m2} (ref >59)

## 2022-06-11 LAB — LIPID PANEL
Chol/HDL Ratio: 4.8 ratio — ABNORMAL HIGH (ref 0.0–4.4)
Cholesterol, Total: 252 mg/dL — ABNORMAL HIGH (ref 100–199)
HDL: 53 mg/dL
LDL Chol Calc (NIH): 172 mg/dL — ABNORMAL HIGH (ref 0–99)
Triglycerides: 148 mg/dL (ref 0–149)
VLDL Cholesterol Cal: 27 mg/dL (ref 5–40)

## 2022-06-11 LAB — VITAMIN D 25 HYDROXY (VIT D DEFICIENCY, FRACTURES): Vit D, 25-Hydroxy: 28 ng/mL — ABNORMAL LOW (ref 30.0–100.0)

## 2022-08-16 ENCOUNTER — Other Ambulatory Visit: Payer: Self-pay

## 2022-08-16 ENCOUNTER — Other Ambulatory Visit (HOSPITAL_COMMUNITY): Payer: Self-pay

## 2022-08-22 ENCOUNTER — Other Ambulatory Visit: Payer: Self-pay

## 2022-09-06 ENCOUNTER — Other Ambulatory Visit: Payer: Self-pay

## 2022-09-06 ENCOUNTER — Other Ambulatory Visit (HOSPITAL_COMMUNITY): Payer: Self-pay

## 2022-10-06 ENCOUNTER — Ambulatory Visit: Payer: Commercial Managed Care - PPO | Admitting: Family Medicine

## 2022-10-06 ENCOUNTER — Other Ambulatory Visit: Payer: Self-pay | Admitting: Family Medicine

## 2022-10-06 ENCOUNTER — Ambulatory Visit (INDEPENDENT_AMBULATORY_CARE_PROVIDER_SITE_OTHER): Payer: Commercial Managed Care - PPO

## 2022-10-06 VITALS — Ht 67.0 in

## 2022-10-06 DIAGNOSIS — S99912A Unspecified injury of left ankle, initial encounter: Secondary | ICD-10-CM

## 2022-10-06 DIAGNOSIS — S92025A Nondisplaced fracture of anterior process of left calcaneus, initial encounter for closed fracture: Secondary | ICD-10-CM | POA: Diagnosis not present

## 2022-10-06 DIAGNOSIS — S99922A Unspecified injury of left foot, initial encounter: Secondary | ICD-10-CM

## 2022-10-06 DIAGNOSIS — S82892A Other fracture of left lower leg, initial encounter for closed fracture: Secondary | ICD-10-CM

## 2022-10-06 DIAGNOSIS — S93491D Sprain of other ligament of right ankle, subsequent encounter: Secondary | ICD-10-CM | POA: Diagnosis not present

## 2022-10-06 DIAGNOSIS — S99921A Unspecified injury of right foot, initial encounter: Secondary | ICD-10-CM

## 2022-10-06 NOTE — Progress Notes (Signed)
Subjective:  Patient ID: Julia Burton, female    DOB: 1981-02-05, 42 y.o.   MRN: [id number]  Patient Care Team: Gabriel Earing, FNP as PCP - General (Family Medicine) Suzi Roots as Physician Assistant (Dermatology)   Chief Complaint:  Ankle Injury   HPI: Julia Burton is a 42 y.o. female presenting on 10/06/2022 for Ankle Injury   Ankle Injury  The incident occurred 6 to 12 hours ago. The injury mechanism was an inversion injury (while playing softball). The pain is present in the left ankle, left foot and left heel. The pain is moderate. The pain has been Constant since onset. Pertinent negatives include no loss of motion, loss of sensation, muscle weakness, numbness or tingling. She reports no foreign bodies present. The symptoms are aggravated by movement, palpation and weight bearing. She has tried acetaminophen and immobilization for the symptoms. The treatment provided mild relief.     Relevant past medical, surgical, family, and social history reviewed and updated as indicated.  Allergies and medications reviewed and updated. Data reviewed: Chart in Epic.   Past Medical History:  Diagnosis Date   Anemia    Anxiety    Dysplastic nevus 09/07/2010   mild, right post shoulder   History of kidney stones    Medical history non-contributory     Past Surgical History:  Procedure Laterality Date   CESAREAN SECTION     FTP   CESAREAN SECTION N/A 11/06/2013   Procedure: CESAREAN SECTION;  Surgeon: Zelphia Cairo, MD;  Location: [facility];  Service: Obstetrics;  Laterality: N/A;   KNEE SURGERY     WISDOM TOOTH EXTRACTION      Social History   Socioeconomic History   Marital status: Married    Spouse name: Not on file   Number of children: Not on file   Years of education: Not on file   Highest education level: Bachelor's degree (e.g., BA, AB, BS)  Occupational History   Not on file  Tobacco Use   Smoking status: Never   Smokeless tobacco:  Never  Vaping Use   Vaping status: Never Used  Substance and Sexual Activity   Alcohol use: No   Drug use: No   Sexual activity: Yes    Birth control/protection: None  Other Topics Concern   Not on file  Social History Narrative   Not on file   Social Determinants of Health   Financial Resource Strain: Low Risk  (10/06/2022)   Overall Financial Resource Strain (CARDIA)    Difficulty of Paying Living Expenses: Not hard at all  Food Insecurity: No Food Insecurity (10/06/2022)   Hunger Vital Sign    Worried About Running Out of Food in the Last Year: Never true    Ran Out of Food in the Last Year: Never true  Transportation Needs: No Transportation Needs (10/06/2022)   PRAPARE - Administrator, Civil Service (Medical): No    Lack of Transportation (Non-Medical): No  Physical Activity: Sufficiently Active (10/06/2022)   Exercise Vital Sign    Days of Exercise per Week: 6 days    Minutes of Exercise per Session: 30 min  Stress: No Stress Concern Present (10/06/2022)   [employer] of Occupational Health - Occupational Stress Questionnaire    Feeling of Stress : Only a little  Social Connections: Unknown (10/06/2022)   Social Connection and Isolation Panel [NHANES]    Frequency of Communication with Friends and Family: More than three times a  week    Frequency of Social Gatherings with Friends and Family: More than three times a week    Attends Religious Services: Patient declined    Active Member of Clubs or Organizations: Yes    Attends Banker Meetings: More than 4 times per year    Marital Status: Married  Catering manager Violence: Not on file    Outpatient Encounter Medications as of 10/06/2022  Medication Sig   ALTAVERA 0.15-30 MG-MCG tablet Take 1 tablet by mouth daily.   FLUoxetine (PROZAC) 20 MG capsule Take 1 capsule (20 mg total) by mouth daily. Take with 40 mg tablet daily for total of 60 mg   FLUoxetine (PROZAC) 40 MG capsule Take 1  capsule (40 mg total) by mouth daily.   No facility-administered encounter medications on file as of 10/06/2022.    No Known Allergies  Review of Systems  Musculoskeletal:  Positive for arthralgias, gait problem, joint swelling and myalgias.  Skin:  Positive for color change.  Neurological:  Negative for tingling and numbness.  All other systems reviewed and are negative.       Objective:  Ht 5\' 7"  (1.702 m)   BMI 32.28 kg/m    Wt Readings from Last 3 Encounters:  06/10/22 206 lb 2 oz (93.5 kg)  04/15/22 209 lb (94.8 kg)  03/10/22 209 lb 8 oz (95 kg)    Physical Exam Vitals and nursing note reviewed.  Constitutional:      General: She is not in acute distress.    Appearance: Normal appearance. She is well-developed and well-groomed. She is obese. She is not ill-appearing, toxic-appearing or diaphoretic.  HENT:     Head: Normocephalic and atraumatic.     Jaw: There is normal jaw occlusion.     Right Ear: Hearing normal.     Left Ear: Hearing normal.     Nose: Nose normal.     Mouth/Throat:     Lips: Pink.     Mouth: Mucous membranes are moist.     Pharynx: Oropharynx is clear. Uvula midline.  Eyes:     General: Lids are normal.     Extraocular Movements: Extraocular movements intact.     Conjunctiva/sclera: Conjunctivae normal.     Pupils: Pupils are equal, round, and reactive to light.  Neck:     Trachea: Trachea and phonation normal.  Cardiovascular:     Rate and Rhythm: Normal rate and regular rhythm.     Chest Wall: PMI is not displaced.     Pulses: Normal pulses.          Dorsalis pedis pulses are 2+ on the left side.       Posterior tibial pulses are 2+ on the left side.     Heart sounds: Normal heart sounds. No murmur heard.    No friction rub. No gallop.  Pulmonary:     Effort: Pulmonary effort is normal. No respiratory distress.     Breath sounds: Normal breath sounds. No wheezing.  Musculoskeletal:     Cervical back: Normal range of motion and neck  supple.     Right lower leg: No edema.     Left lower leg: Normal. No edema.     Left ankle: Swelling and ecchymosis present. Tenderness present over the lateral malleolus. Decreased range of motion.     Left foot: Decreased range of motion. Normal capillary refill. Swelling, tenderness and bony tenderness present. No bunion, Charcot foot, foot drop, prominent metatarsal heads, laceration or crepitus. Normal  pulse.       Feet:  Skin:    General: Skin is warm and dry.     Capillary Refill: Capillary refill takes less than 2 seconds.     Coloration: Skin is not cyanotic, jaundiced or pale.     Findings: No rash.  Neurological:     General: No focal deficit present.     Mental Status: She is alert and oriented to person, place, and time.     Sensory: Sensation is intact.     Motor: Motor function is intact.     Coordination: Coordination is intact.     Gait: Gait is intact.     Deep Tendon Reflexes: Reflexes are normal and symmetric.  Psychiatric:        Attention and Perception: Attention and perception normal.        Mood and Affect: Mood and affect normal.        Speech: Speech normal.        Behavior: Behavior normal. Behavior is cooperative.        Thought Content: Thought content normal.        Cognition and Memory: Cognition and memory normal.        Judgment: Judgment normal.     Results for orders placed or performed in visit on 06/10/22  Lipid panel  Result Value Ref Range   Cholesterol, Total 252 (H) 100 - 199 mg/dL   Triglycerides 629 0 - 149 mg/dL   HDL 53 >52 mg/dL   VLDL Cholesterol Cal 27 5 - 40 mg/dL   LDL Chol Calc (NIH) 841 (H) 0 - 99 mg/dL   Chol/HDL Ratio 4.8 (H) 0.0 - 4.4 ratio  Vitamin D, 25-hydroxy  Result Value Ref Range   Vit D, 25-Hydroxy 28.0 (L) 30.0 - 100.0 ng/mL  CBC with Differential/Platelet  Result Value Ref Range   WBC 8.8 3.4 - 10.8 x10E3/uL   RBC 4.31 3.77 - 5.28 x10E6/uL   Hemoglobin 13.1 11.1 - 15.9 g/dL   Hematocrit 32.4 40.1 - 46.6  %   MCV 91 79 - 97 fL   MCH 30.4 26.6 - 33.0 pg   MCHC 33.5 31.5 - 35.7 g/dL   RDW 02.7 25.3 - 66.4 %   Platelets 325 150 - 450 x10E3/uL   Neutrophils 51 Not Estab. %   Lymphs 41 Not Estab. %   Monocytes 6 Not Estab. %   Eos 1 Not Estab. %   Basos 1 Not Estab. %   Neutrophils Absolute 4.5 1.4 - 7.0 x10E3/uL   Lymphocytes Absolute 3.6 (H) 0.7 - 3.1 x10E3/uL   Monocytes Absolute 0.5 0.1 - 0.9 x10E3/uL   EOS (ABSOLUTE) 0.1 0.0 - 0.4 x10E3/uL   Basophils Absolute 0.1 0.0 - 0.2 x10E3/uL   Immature Granulocytes 0 Not Estab. %   Immature Grans (Abs) 0.0 0.0 - 0.1 x10E3/uL  CMP14+EGFR  Result Value Ref Range   Glucose 96 70 - 99 mg/dL   BUN 10 6 - 24 mg/dL   Creatinine, Ser 4.03 0.57 - 1.00 mg/dL   eGFR 79 >47 QQ/VZD/6.38   BUN/Creatinine Ratio 11 9 - 23   Sodium 139 134 - 144 mmol/L   Potassium 4.5 3.5 - 5.2 mmol/L   Chloride 104 96 - 106 mmol/L   CO2 20 20 - 29 mmol/L   Calcium 9.1 8.7 - 10.2 mg/dL   Total Protein 6.7 6.0 - 8.5 g/dL   Albumin 4.2 3.9 - 4.9 g/dL   Globulin, Total 2.5 1.5 -  4.5 g/dL   Albumin/Globulin Ratio 1.7 1.2 - 2.2   Bilirubin Total 0.3 0.0 - 1.2 mg/dL   Alkaline Phosphatase 71 44 - 121 IU/L   AST 12 0 - 40 IU/L   ALT 9 0 - 32 IU/L       Pertinent labs & imaging results that were available during my care of the patient were reviewed by me and considered in my medical decision making.  Assessment & Plan:  Allanah was seen today for ankle injury.  Diagnoses and all orders for this visit:  Injury of left ankle and left foot, initial encounter Imaging in office positive for fracture, sent for STAT read: IMPRESSION: 1. Acute nondisplaced fracture of the anterior process of the calcaneus. 2. Small ossific fragment adjacent to the talonavicular joint suspicious for an additional fracture of the talus or navicular. 3. No acute fracture of the distal tibia or fibula.-     DG Ankle Complete Left; Future  In cast boot. Start crutches use. Follow up with  ortho, appointment scheduled.   -     DG foot Complete Left; Future -     Ambulatory referral to Orthopedic Surgery  Closed avulsion fracture of left ankle, initial encounter Closed nondisplaced fracture of anterior process of left calcaneus, initial encounter Cast boot and crutches, referral to ortho. RICE therapy.  -     Ambulatory referral to Orthopedic Surgery     Continue all other maintenance medications.  Follow up plan: Return if symptoms worsen or fail to improve.   Continue healthy lifestyle choices, including diet (rich in fruits, vegetables, and lean proteins, and low in salt and simple carbohydrates) and exercise (at least 30 minutes of moderate physical activity daily).  Educational handout given for ankle pain  The above assessment and management plan was discussed with the patient. The patient verbalized understanding of and has agreed to the management plan. Patient is aware to call the clinic if they develop any new symptoms or if symptoms persist or worsen. Patient is aware when to return to the clinic for a follow-up visit. Patient educated on when it is appropriate to go to the emergency department.   Kari Baars, FNP-C [facility] [phone]

## 2022-11-15 ENCOUNTER — Other Ambulatory Visit (HOSPITAL_COMMUNITY): Payer: Self-pay

## 2022-12-05 ENCOUNTER — Other Ambulatory Visit (HOSPITAL_COMMUNITY): Payer: Self-pay

## 2023-01-11 ENCOUNTER — Other Ambulatory Visit: Payer: Commercial Managed Care - PPO

## 2023-01-13 ENCOUNTER — Ambulatory Visit: Payer: Commercial Managed Care - PPO | Admitting: Family Medicine

## 2023-01-13 ENCOUNTER — Other Ambulatory Visit: Payer: Self-pay | Admitting: Family Medicine

## 2023-01-13 DIAGNOSIS — F411 Generalized anxiety disorder: Secondary | ICD-10-CM

## 2023-01-13 DIAGNOSIS — F339 Major depressive disorder, recurrent, unspecified: Secondary | ICD-10-CM

## 2023-01-13 MED ORDER — FLUOXETINE HCL 20 MG PO CAPS
20.0000 mg | ORAL_CAPSULE | Freq: Every day | ORAL | 3 refills | Status: DC
Start: 2023-01-13 — End: 2023-02-06

## 2023-01-13 MED ORDER — FLUOXETINE HCL 40 MG PO CAPS
40.0000 mg | ORAL_CAPSULE | Freq: Every day | ORAL | 3 refills | Status: DC
Start: 2023-01-13 — End: 2023-02-06

## 2023-02-03 ENCOUNTER — Ambulatory Visit: Payer: Commercial Managed Care - PPO | Admitting: Family Medicine

## 2023-02-03 ENCOUNTER — Encounter: Payer: Self-pay | Admitting: Family Medicine

## 2023-02-03 VITALS — BP 135/82 | HR 76 | Temp 98.4°F | Ht 67.0 in | Wt 207.2 lb

## 2023-02-03 DIAGNOSIS — F411 Generalized anxiety disorder: Secondary | ICD-10-CM

## 2023-02-03 DIAGNOSIS — F339 Major depressive disorder, recurrent, unspecified: Secondary | ICD-10-CM

## 2023-02-03 DIAGNOSIS — E782 Mixed hyperlipidemia: Secondary | ICD-10-CM | POA: Diagnosis not present

## 2023-02-03 DIAGNOSIS — E559 Vitamin D deficiency, unspecified: Secondary | ICD-10-CM | POA: Diagnosis not present

## 2023-02-03 NOTE — Progress Notes (Signed)
Established Patient Office Visit  Subjective   Patient ID: Julia Burton, female    DOB: September 01, 1980  Age: 42 y.o. MRN: [id number]  Chief Complaint  Patient presents with   Medical Management of Chronic Issues   Depression    HPI Julia Burton is here for a chronic follow up.   HLD She was doing well with exercising until she broke her ankle. Since then she has gotten of track with exercise and then her diet. She plans to get back on track with this after the holidays.   2. Anxiety/depression Reports manageable with prozac. Denies side effects. Does not desire a change in treatment currently.   3. Vitamin D deficiency Taking daily OTC supplement.      02/03/2023    1:02 PM 06/10/2022   10:04 AM 04/15/2022    1:03 PM  Depression screen PHQ 2/9  Decreased Interest 1 1 1   Down, Depressed, Hopeless 1 1 1   PHQ - 2 Score 2 2 2   Altered sleeping 1 1 0  Tired, decreased energy 1 1 1   Change in appetite 1 1 0  Feeling bad or failure about yourself  1 1 1   Trouble concentrating 0 0 0  Moving slowly or fidgety/restless 0 0 0  Suicidal thoughts 1 0 0  PHQ-9 Score 7 6 4   Difficult doing work/chores Somewhat difficult Somewhat difficult Somewhat difficult      02/03/2023    1:01 PM 06/10/2022   10:05 AM 04/15/2022    1:04 PM 03/10/2022    4:40 PM  GAD 7 : Generalized Anxiety Score  Nervous, Anxious, on Edge 1 1 1 2   Control/stop worrying 1 1 1 2   Worry too much - different things 1 0 1 2  Trouble relaxing 1 1 0 2  Restless 0 0 0 0  Easily annoyed or irritable 1 1 1 2   Afraid - awful might happen 1 1 0 0  Total GAD 7 Score 6 5 4 10   Anxiety Difficulty Somewhat difficult Not difficult at all Somewhat difficult Somewhat difficult       ROS Negative unless specially indicated above in HPI.   Objective:     BP 135/82   Pulse 76   Temp 98.4 F (36.9 C) (Temporal)   Ht 5\' 7"  (1.702 m)   Wt 207 lb 4 oz (94 kg)   SpO2 99%   BMI 32.46 kg/m    Physical Exam Vitals  and nursing note reviewed.  Constitutional:      General: She is not in acute distress.    Appearance: She is not ill-appearing, toxic-appearing or diaphoretic.  Cardiovascular:     Rate and Rhythm: Normal rate and regular rhythm.     Heart sounds: Normal heart sounds. No murmur heard. Pulmonary:     Effort: Pulmonary effort is normal. No respiratory distress.     Breath sounds: Normal breath sounds.  Musculoskeletal:     Right lower leg: No edema.     Left lower leg: No edema.  Skin:    General: Skin is warm and dry.  Neurological:     General: No focal deficit present.     Mental Status: She is alert and oriented to person, place, and time.  Psychiatric:        Mood and Affect: Mood normal.        Behavior: Behavior normal.      No results found for any visits on 02/03/23.    The 10-year ASCVD risk  score (Arnett DK, et al., 2019) is: 1.1%    Assessment & Plan:   Julia Burton was seen today for medical management of chronic issues and depression.  Diagnoses and all orders for this visit:  Depression, recurrent (HCC) GAD (generalized anxiety disorder) Well controlled on current regimen.   Vitamin D deficiency Continue OTC supplement. Will recheck at next appt.   Mixed hyperlipidemia Diet, exercise, weight loss. Will recheck fasting panel at next appt.    Return in about 18 weeks (around 06/09/2023) for CPE with fasting labs.   The patient indicates understanding of these issues and agrees with the plan.  Gabriel Earing, FNP

## 2023-02-05 ENCOUNTER — Encounter: Payer: Self-pay | Admitting: Family Medicine

## 2023-02-06 ENCOUNTER — Other Ambulatory Visit: Payer: Self-pay

## 2023-02-06 ENCOUNTER — Other Ambulatory Visit: Payer: Self-pay | Admitting: Family Medicine

## 2023-02-06 ENCOUNTER — Other Ambulatory Visit (HOSPITAL_COMMUNITY): Payer: Self-pay

## 2023-02-06 DIAGNOSIS — F411 Generalized anxiety disorder: Secondary | ICD-10-CM

## 2023-02-06 DIAGNOSIS — F339 Major depressive disorder, recurrent, unspecified: Secondary | ICD-10-CM

## 2023-02-06 MED ORDER — FLUOXETINE HCL 20 MG PO CAPS
20.0000 mg | ORAL_CAPSULE | Freq: Every day | ORAL | 3 refills | Status: DC
  Filled 2023-02-06 – 2023-03-01 (×3): qty 90, 90d supply, fill #0
  Filled 2023-05-30: qty 90, 90d supply, fill #1
  Filled 2023-08-28: qty 90, 90d supply, fill #2

## 2023-02-06 MED ORDER — FLUOXETINE HCL 40 MG PO CAPS
40.0000 mg | ORAL_CAPSULE | Freq: Every day | ORAL | 3 refills | Status: DC
  Filled 2023-02-06: qty 90, 90d supply, fill #0
  Filled 2023-05-14: qty 90, 90d supply, fill #1
  Filled 2023-08-09: qty 90, 90d supply, fill #2

## 2023-03-01 ENCOUNTER — Encounter (HOSPITAL_COMMUNITY): Payer: Self-pay | Admitting: Pharmacist

## 2023-03-01 ENCOUNTER — Other Ambulatory Visit (HOSPITAL_COMMUNITY): Payer: Self-pay

## 2023-03-17 DIAGNOSIS — M25511 Pain in right shoulder: Secondary | ICD-10-CM | POA: Diagnosis not present

## 2023-03-22 DIAGNOSIS — Z6832 Body mass index (BMI) 32.0-32.9, adult: Secondary | ICD-10-CM | POA: Diagnosis not present

## 2023-03-22 DIAGNOSIS — Z01419 Encounter for gynecological examination (general) (routine) without abnormal findings: Secondary | ICD-10-CM | POA: Diagnosis not present

## 2023-03-22 DIAGNOSIS — Z124 Encounter for screening for malignant neoplasm of cervix: Secondary | ICD-10-CM | POA: Diagnosis not present

## 2023-03-22 DIAGNOSIS — Z1151 Encounter for screening for human papillomavirus (HPV): Secondary | ICD-10-CM | POA: Diagnosis not present

## 2023-03-22 DIAGNOSIS — Z1231 Encounter for screening mammogram for malignant neoplasm of breast: Secondary | ICD-10-CM | POA: Diagnosis not present

## 2023-03-22 DIAGNOSIS — Z3040 Encounter for surveillance of contraceptives, unspecified: Secondary | ICD-10-CM | POA: Diagnosis not present

## 2023-03-27 ENCOUNTER — Other Ambulatory Visit: Payer: Self-pay | Admitting: Obstetrics and Gynecology

## 2023-03-27 DIAGNOSIS — R928 Other abnormal and inconclusive findings on diagnostic imaging of breast: Secondary | ICD-10-CM

## 2023-04-14 ENCOUNTER — Ambulatory Visit
Admission: RE | Admit: 2023-04-14 | Discharge: 2023-04-14 | Disposition: A | Discharge status: Home / Self Care | Payer: Commercial Managed Care - PPO | Source: Ambulatory Visit | Attending: Obstetrics and Gynecology | Admitting: Obstetrics and Gynecology

## 2023-04-14 ENCOUNTER — Other Ambulatory Visit: Payer: Self-pay | Admitting: Obstetrics and Gynecology

## 2023-04-14 DIAGNOSIS — R928 Other abnormal and inconclusive findings on diagnostic imaging of breast: Secondary | ICD-10-CM

## 2023-04-14 DIAGNOSIS — Z3202 Encounter for pregnancy test, result negative: Secondary | ICD-10-CM | POA: Diagnosis not present

## 2023-04-14 DIAGNOSIS — N6325 Unspecified lump in the left breast, overlapping quadrants: Secondary | ICD-10-CM | POA: Diagnosis not present

## 2023-04-14 DIAGNOSIS — N6012 Diffuse cystic mastopathy of left breast: Secondary | ICD-10-CM | POA: Diagnosis not present

## 2023-04-14 DIAGNOSIS — R87610 Atypical squamous cells of undetermined significance on cytologic smear of cervix (ASC-US): Secondary | ICD-10-CM | POA: Diagnosis not present

## 2023-04-14 DIAGNOSIS — N6320 Unspecified lump in the left breast, unspecified quadrant: Secondary | ICD-10-CM

## 2023-05-15 ENCOUNTER — Other Ambulatory Visit (HOSPITAL_COMMUNITY): Payer: Self-pay

## 2023-05-15 ENCOUNTER — Encounter: Payer: Self-pay | Admitting: Pharmacist

## 2023-05-15 ENCOUNTER — Other Ambulatory Visit: Payer: Self-pay

## 2023-05-30 ENCOUNTER — Other Ambulatory Visit (HOSPITAL_COMMUNITY): Payer: Self-pay

## 2023-06-06 ENCOUNTER — Telehealth: Payer: Self-pay | Admitting: Family Medicine

## 2023-06-06 DIAGNOSIS — E782 Mixed hyperlipidemia: Secondary | ICD-10-CM

## 2023-06-06 DIAGNOSIS — Z130 Encounter for screening for diseases of the blood and blood-forming organs and certain disorders involving the immune mechanism: Secondary | ICD-10-CM

## 2023-06-06 DIAGNOSIS — E559 Vitamin D deficiency, unspecified: Secondary | ICD-10-CM

## 2023-06-07 NOTE — Telephone Encounter (Signed)
 Lab orders placed.

## 2023-06-21 ENCOUNTER — Other Ambulatory Visit: Payer: Commercial Managed Care - PPO

## 2023-06-21 DIAGNOSIS — E782 Mixed hyperlipidemia: Secondary | ICD-10-CM | POA: Diagnosis not present

## 2023-06-21 DIAGNOSIS — E559 Vitamin D deficiency, unspecified: Secondary | ICD-10-CM

## 2023-06-21 DIAGNOSIS — Z130 Encounter for screening for diseases of the blood and blood-forming organs and certain disorders involving the immune mechanism: Secondary | ICD-10-CM | POA: Diagnosis not present

## 2023-06-21 DIAGNOSIS — Z13228 Encounter for screening for other metabolic disorders: Secondary | ICD-10-CM | POA: Diagnosis not present

## 2023-06-21 DIAGNOSIS — Z1329 Encounter for screening for other suspected endocrine disorder: Secondary | ICD-10-CM | POA: Diagnosis not present

## 2023-06-21 LAB — LIPID PANEL

## 2023-06-22 ENCOUNTER — Encounter: Payer: Self-pay | Admitting: Family Medicine

## 2023-06-22 LAB — CBC WITH DIFFERENTIAL/PLATELET
Basophils Absolute: 0.1 10*3/uL (ref 0.0–0.2)
Basos: 1 %
EOS (ABSOLUTE): 0.1 10*3/uL (ref 0.0–0.4)
Eos: 1 %
Hematocrit: 41.3 % (ref 34.0–46.6)
Hemoglobin: 13.3 g/dL (ref 11.1–15.9)
Immature Grans (Abs): 0 10*3/uL (ref 0.0–0.1)
Immature Granulocytes: 0 %
Lymphocytes Absolute: 4.3 10*3/uL — ABNORMAL HIGH (ref 0.7–3.1)
Lymphs: 40 %
MCH: 29.4 pg (ref 26.6–33.0)
MCHC: 32.2 g/dL (ref 31.5–35.7)
MCV: 91 fL (ref 79–97)
Monocytes Absolute: 0.8 10*3/uL (ref 0.1–0.9)
Monocytes: 7 %
Neutrophils Absolute: 5.4 10*3/uL (ref 1.4–7.0)
Neutrophils: 51 %
Platelets: 363 10*3/uL (ref 150–450)
RBC: 4.53 x10E6/uL (ref 3.77–5.28)
RDW: 11.8 % (ref 11.7–15.4)
WBC: 10.5 10*3/uL (ref 3.4–10.8)

## 2023-06-22 LAB — CMP14+EGFR
ALT: 10 IU/L (ref 0–32)
AST: 16 IU/L (ref 0–40)
Albumin: 4 g/dL (ref 3.9–4.9)
Alkaline Phosphatase: 78 IU/L (ref 44–121)
BUN/Creatinine Ratio: 9 (ref 9–23)
BUN: 9 mg/dL (ref 6–24)
Bilirubin Total: 0.3 mg/dL (ref 0.0–1.2)
CO2: 22 mmol/L (ref 20–29)
Calcium: 9.5 mg/dL (ref 8.7–10.2)
Chloride: 103 mmol/L (ref 96–106)
Creatinine, Ser: 1.02 mg/dL — ABNORMAL HIGH (ref 0.57–1.00)
Globulin, Total: 2.8 g/dL (ref 1.5–4.5)
Glucose: 92 mg/dL (ref 70–99)
Potassium: 4.6 mmol/L (ref 3.5–5.2)
Sodium: 139 mmol/L (ref 134–144)
Total Protein: 6.8 g/dL (ref 6.0–8.5)
eGFR: 71 mL/min/{1.73_m2} (ref >59)

## 2023-06-22 LAB — LIPID PANEL
Cholesterol, Total: 249 mg/dL — ABNORMAL HIGH (ref 100–199)
HDL: 47 mg/dL (ref >39)
LDL CALC COMMENT:: 5.3 ratio — ABNORMAL HIGH (ref 0.0–4.4)
LDL Chol Calc (NIH): 176 mg/dL — ABNORMAL HIGH (ref 0–99)
Triglycerides: 141 mg/dL (ref 0–149)
VLDL Cholesterol Cal: 26 mg/dL (ref 5–40)

## 2023-06-22 LAB — VITAMIN D 25 HYDROXY (VIT D DEFICIENCY, FRACTURES): Vit D, 25-Hydroxy: 42.5 ng/mL (ref 30.0–100.0)

## 2023-06-22 LAB — TSH: TSH: 3.28 u[IU]/mL (ref 0.450–4.500)

## 2023-06-23 ENCOUNTER — Encounter: Payer: Commercial Managed Care - PPO | Admitting: Family Medicine

## 2023-06-23 ENCOUNTER — Telehealth: Payer: Self-pay | Admitting: Family Medicine

## 2023-06-23 ENCOUNTER — Encounter: Payer: Self-pay | Admitting: Family Medicine

## 2023-06-23 DIAGNOSIS — F411 Generalized anxiety disorder: Secondary | ICD-10-CM

## 2023-06-23 DIAGNOSIS — E782 Mixed hyperlipidemia: Secondary | ICD-10-CM

## 2023-06-23 DIAGNOSIS — E559 Vitamin D deficiency, unspecified: Secondary | ICD-10-CM

## 2023-06-23 DIAGNOSIS — Z0000 Encounter for general adult medical examination without abnormal findings: Secondary | ICD-10-CM

## 2023-06-23 DIAGNOSIS — F339 Major depressive disorder, recurrent, unspecified: Secondary | ICD-10-CM

## 2023-06-23 NOTE — Telephone Encounter (Signed)
 Patient was scheduled for a CPE on 06/23/23 but had to reschedule, she already had her labs on 06/21/23 and asked if we could call her about these if there were any issues.   She is rescheduled for 09/29/23 as of right now but wanted to make sure her labs did not show anything that was concerning to her PCP.

## 2023-06-23 NOTE — Telephone Encounter (Signed)
 See result note.

## 2023-08-09 ENCOUNTER — Other Ambulatory Visit (HOSPITAL_COMMUNITY): Payer: Self-pay

## 2023-08-28 ENCOUNTER — Other Ambulatory Visit (HOSPITAL_COMMUNITY): Payer: Self-pay

## 2023-09-25 ENCOUNTER — Other Ambulatory Visit: Payer: Self-pay | Admitting: Medical Genetics

## 2023-09-29 ENCOUNTER — Encounter: Admitting: Family Medicine

## 2023-10-13 ENCOUNTER — Ambulatory Visit
Admission: RE | Admit: 2023-10-13 | Discharge: 2023-10-13 | Disposition: A | Discharge status: Home / Self Care | Payer: Commercial Managed Care - PPO | Source: Ambulatory Visit | Attending: Obstetrics and Gynecology | Admitting: Obstetrics and Gynecology

## 2023-10-13 DIAGNOSIS — N6320 Unspecified lump in the left breast, unspecified quadrant: Secondary | ICD-10-CM

## 2023-10-13 DIAGNOSIS — R928 Other abnormal and inconclusive findings on diagnostic imaging of breast: Secondary | ICD-10-CM

## 2023-10-13 DIAGNOSIS — N6012 Diffuse cystic mastopathy of left breast: Secondary | ICD-10-CM | POA: Diagnosis not present

## 2023-10-27 ENCOUNTER — Ambulatory Visit: Admitting: Family Medicine

## 2023-10-27 ENCOUNTER — Other Ambulatory Visit: Payer: Self-pay

## 2023-10-27 ENCOUNTER — Encounter: Payer: Self-pay | Admitting: Family Medicine

## 2023-10-27 ENCOUNTER — Other Ambulatory Visit (HOSPITAL_COMMUNITY): Payer: Self-pay

## 2023-10-27 VITALS — BP 127/73 | HR 73 | Temp 98.2°F | Ht 67.0 in | Wt 203.4 lb

## 2023-10-27 DIAGNOSIS — F411 Generalized anxiety disorder: Secondary | ICD-10-CM | POA: Diagnosis not present

## 2023-10-27 DIAGNOSIS — F339 Major depressive disorder, recurrent, unspecified: Secondary | ICD-10-CM

## 2023-10-27 DIAGNOSIS — Z0000 Encounter for general adult medical examination without abnormal findings: Secondary | ICD-10-CM

## 2023-10-27 DIAGNOSIS — Z0001 Encounter for general adult medical examination with abnormal findings: Secondary | ICD-10-CM | POA: Diagnosis not present

## 2023-10-27 MED ORDER — FLUOXETINE HCL 40 MG PO CAPS
40.0000 mg | ORAL_CAPSULE | Freq: Every day | ORAL | 3 refills | Status: AC
  Filled 2023-10-27: qty 90, 90d supply, fill #0
  Filled 2024-02-05: qty 90, 90d supply, fill #1

## 2023-10-27 MED ORDER — FLUOXETINE HCL 20 MG PO CAPS
20.0000 mg | ORAL_CAPSULE | Freq: Every day | ORAL | 3 refills | Status: AC
  Filled 2023-10-27 – 2023-11-22 (×2): qty 90, 90d supply, fill #0
  Filled 2024-02-20: qty 90, 90d supply, fill #1

## 2023-10-27 NOTE — Progress Notes (Signed)
 Complete physical exam  Patient: Julia Burton   DOB: 1980-12-01   43 y.o. Female  MRN: [id number]  Subjective:    Chief Complaint  Patient presents with   Annual Exam    Julia Burton is a 43 y.o. female who presents today for a complete physical exam. She reports consuming a more well balanced diet. She has started to exercise some. She has lost a few lbs since her last visit. She had her screening labs done earlier this year. She generally feels well. She reports sleeping well. She does not have additional problems to discuss today.    Most recent fall risk assessment:    10/27/2023   12:43 PM  Fall Risk   Falls in the past year? 0     Most recent depression screenings:     10/27/2023   12:43 PM 02/03/2023    1:02 PM 06/10/2022   10:04 AM  Depression screen PHQ 2/9  Decreased Interest 1 1 1   Down, Depressed, Hopeless 1 1 1   PHQ - 2 Score 2 2 2   Altered sleeping 1 1 1   Tired, decreased energy 0 1 1  Change in appetite 0 1 1  Feeling bad or failure about yourself  0 1 1  Trouble concentrating 0 0 0  Moving slowly or fidgety/restless 0 0 0  Suicidal thoughts 0 1 0  PHQ-9 Score 3 7 6   Difficult doing work/chores Somewhat difficult Somewhat difficult Somewhat difficult      10/27/2023   12:44 PM 02/03/2023    1:01 PM 06/10/2022   10:05 AM 04/15/2022    1:04 PM  GAD 7 : Generalized Anxiety Score  Nervous, Anxious, on Edge 1 1 1 1   Control/stop worrying 1 1 1 1   Worry too much - different things 1 1 0 1  Trouble relaxing 0 1 1 0  Restless 0 0 0 0  Easily annoyed or irritable 1 1 1 1   Afraid - awful might happen 0 1 1 0  Total GAD 7 Score 4 6 5 4   Anxiety Difficulty Somewhat difficult Somewhat difficult Not difficult at all Somewhat difficult          Patient Care Team: Joesph Annabella HERO, FNP as PCP - General (Family Medicine) [place], Andrez SAUNDERS, PA-C (Inactive) as Physician Assistant (Dermatology)   Outpatient Medications Prior to Visit  Medication  Sig   ALTAVERA 0.15-30 MG-MCG tablet Take 1 tablet by mouth daily.   FLUoxetine  (PROZAC ) 20 MG capsule Take 1 capsule (20 mg total) by mouth daily. (Take with 40 mg capsule daily for total of 60 mg)   FLUoxetine  (PROZAC ) 40 MG capsule Take 1 capsule (40 mg total) by mouth daily.   No facility-administered medications prior to visit.    ROS As per HPI.     Objective:     BP 127/73   Pulse 73   Temp 98.2 F (36.8 C) (Temporal)   Ht 5' 7 (1.702 m)   Wt 203 lb 6.4 oz (92.3 kg)   SpO2 97%   BMI 31.86 kg/m  Wt Readings from Last 3 Encounters:  10/27/23 203 lb 6.4 oz (92.3 kg)  02/03/23 207 lb 4 oz (94 kg)  06/10/22 206 lb 2 oz (93.5 kg)      Physical Exam Vitals and nursing note reviewed.  Constitutional:      General: She is not in acute distress.    Appearance: Normal appearance. She is not ill-appearing, toxic-appearing or diaphoretic.  HENT:  Head: Normocephalic.     Right Ear: Tympanic membrane, ear canal and external ear normal.     Left Ear: Tympanic membrane, ear canal and external ear normal.     Nose: Nose normal.     Mouth/Throat:     Mouth: Mucous membranes are moist.     Pharynx: Oropharynx is clear.  Eyes:     Extraocular Movements: Extraocular movements intact.     Conjunctiva/sclera: Conjunctivae normal.     Pupils: Pupils are equal, round, and reactive to light.  Neck:     Thyroid: No thyroid mass, thyromegaly or thyroid tenderness.  Cardiovascular:     Rate and Rhythm: Normal rate and regular rhythm.     Pulses: Normal pulses.     Heart sounds: Normal heart sounds. No murmur heard.    No friction rub. No gallop.  Pulmonary:     Effort: Pulmonary effort is normal.     Breath sounds: Normal breath sounds.  Abdominal:     General: Bowel sounds are normal. There is no distension.     Palpations: Abdomen is soft. There is no mass.     Tenderness: There is no abdominal tenderness. There is no guarding.  Musculoskeletal:        General: No  swelling or tenderness. Normal range of motion.     Cervical back: Normal range of motion and neck supple. No tenderness.  Skin:    General: Skin is warm and dry.     Capillary Refill: Capillary refill takes less than 2 seconds.     Findings: No lesion or rash.  Neurological:     General: No focal deficit present.     Mental Status: She is alert and oriented to person, place, and time.     Cranial Nerves: No cranial nerve deficit.     Motor: No weakness.     Gait: Gait normal.  Psychiatric:        Mood and Affect: Mood normal.        Behavior: Behavior normal.        Thought Content: Thought content normal.        Judgment: Judgment normal.      No results found for any visits on 10/27/23.     Assessment & Plan:    Routine Health Maintenance and Physical Exam  Vi was seen today for annual exam.  Diagnoses and all orders for this visit:  Routine general medical examination at a health care facility Reviewed labs with patient from earlier this year.   Depression, recurrent (HCC) GAD (generalized anxiety disorder) Well controlled on current regimen.  -     FLUoxetine  (PROZAC ) 20 MG capsule; Take 1 capsule (20 mg total) by mouth daily. (Take with 40 mg capsule daily for total of 60 mg) -     FLUoxetine  (PROZAC ) 40 MG capsule; Take 1 capsule (40 mg total) by mouth daily.    Immunization History  Administered Date(s) Administered   Influenza,inj,Quad PF,6+ Mos 11/08/2013   Influenza-Unspecified 11/23/2017, 11/22/2018, 11/28/2019, 12/07/2022   MMR 09/05/2014, 10/06/2014   Moderna Sars-Covid-2 Vaccination 10/09/2019   PPD Test 12/29/2014, 02/17/2015   Td 10/25/2016    Health Maintenance  Topic Date Due   Cervical Cancer Screening (HPV/Pap Cotest)  12/17/2016   Influenza Vaccine  05/21/2024 (Originally 09/22/2023)   Hepatitis B Vaccines 19-59 Average Risk (1 of 3 - 19+ 3-dose series) 10/26/2024 (Originally 11/04/2000)   HPV VACCINES (1 - 3-dose SCDM series)  10/26/2024 (Originally 11/04/2008)   Hepatitis  C Screening  10/26/2024 (Originally 11/05/1999)   COVID-19 Vaccine (2 - 2025-26 season) 11/11/2024 (Originally 10/23/2023)   DTaP/Tdap/Td (2 - Tdap) 10/26/2026   HIV Screening  Completed   Pneumococcal Vaccine  Aged Out   Meningococcal B Vaccine  Aged Out    Discussed health benefits of physical activity, and encouraged her to engage in regular exercise appropriate for her age and condition.  Problem List Items Addressed This Visit       Other   Depression, recurrent (HCC)   Relevant Medications   FLUoxetine  (PROZAC ) 20 MG capsule   FLUoxetine  (PROZAC ) 40 MG capsule   GAD (generalized anxiety disorder)   Relevant Medications   FLUoxetine  (PROZAC ) 20 MG capsule   FLUoxetine  (PROZAC ) 40 MG capsule   Other Visit Diagnoses       Routine general medical examination at a health care facility    -  Primary      Return in 6 months (on 04/25/2024) for medication follow up.   The patient indicates understanding of these issues and agrees with the plan.  Annabella CHRISTELLA Search, FNP

## 2023-10-27 NOTE — Patient Instructions (Signed)

## 2023-11-22 ENCOUNTER — Other Ambulatory Visit (HOSPITAL_COMMUNITY): Payer: Self-pay

## 2023-11-22 ENCOUNTER — Other Ambulatory Visit: Payer: Self-pay

## 2023-11-24 DIAGNOSIS — M25561 Pain in right knee: Secondary | ICD-10-CM | POA: Diagnosis not present

## 2023-12-01 ENCOUNTER — Other Ambulatory Visit: Payer: Self-pay | Admitting: *Deleted

## 2023-12-01 DIAGNOSIS — Z006 Encounter for examination for normal comparison and control in clinical research program: Secondary | ICD-10-CM

## 2023-12-13 DIAGNOSIS — M7631 Iliotibial band syndrome, right leg: Secondary | ICD-10-CM | POA: Diagnosis not present

## 2023-12-13 DIAGNOSIS — M25561 Pain in right knee: Secondary | ICD-10-CM | POA: Diagnosis not present

## 2023-12-27 LAB — GENECONNECT MOLECULAR SCREEN: Genetic Analysis Overall Interpretation: POSITIVE — AB

## 2023-12-28 ENCOUNTER — Telehealth: Payer: Self-pay | Admitting: Medical Genetics

## 2023-12-28 DIAGNOSIS — E78019 Familial hypercholesterolemia, unspecified: Secondary | ICD-10-CM

## 2024-01-05 DIAGNOSIS — E78019 Familial hypercholesterolemia, unspecified: Secondary | ICD-10-CM | POA: Insufficient documentation

## 2024-01-05 NOTE — Telephone Encounter (Signed)
 Was not able to speak to patient about the results. Will send a certified letter.

## 2024-01-09 ENCOUNTER — Encounter: Payer: Self-pay | Admitting: Medical Genetics

## 2024-01-09 ENCOUNTER — Other Ambulatory Visit: Payer: Self-pay | Admitting: Medical Genetics

## 2024-01-09 NOTE — Progress Notes (Signed)
 After three unsuccessful contact attempts to notify the [employer] participant, Julia Burton [id number], of their positive results, a certified letter has been sent the their address on file notifying them of their results on 01/09/24.

## 2024-01-10 DIAGNOSIS — L821 Other seborrheic keratosis: Secondary | ICD-10-CM | POA: Diagnosis not present

## 2024-01-10 DIAGNOSIS — L738 Other specified follicular disorders: Secondary | ICD-10-CM | POA: Diagnosis not present

## 2024-01-10 DIAGNOSIS — R238 Other skin changes: Secondary | ICD-10-CM | POA: Diagnosis not present

## 2024-01-10 DIAGNOSIS — D1801 Hemangioma of skin and subcutaneous tissue: Secondary | ICD-10-CM | POA: Diagnosis not present

## 2024-01-10 DIAGNOSIS — L814 Other melanin hyperpigmentation: Secondary | ICD-10-CM | POA: Diagnosis not present

## 2024-01-10 DIAGNOSIS — R208 Other disturbances of skin sensation: Secondary | ICD-10-CM | POA: Diagnosis not present

## 2024-01-10 DIAGNOSIS — L538 Other specified erythematous conditions: Secondary | ICD-10-CM | POA: Diagnosis not present

## 2024-02-05 ENCOUNTER — Other Ambulatory Visit: Payer: Self-pay

## 2024-02-06 ENCOUNTER — Encounter: Payer: Self-pay | Admitting: Pharmacist

## 2024-02-06 ENCOUNTER — Other Ambulatory Visit (HOSPITAL_COMMUNITY): Payer: Self-pay

## 2024-02-06 ENCOUNTER — Other Ambulatory Visit: Payer: Self-pay

## 2024-02-20 ENCOUNTER — Other Ambulatory Visit: Payer: Self-pay

## 2024-02-21 ENCOUNTER — Other Ambulatory Visit: Payer: Self-pay

## 2024-02-21 ENCOUNTER — Other Ambulatory Visit (HOSPITAL_COMMUNITY): Payer: Self-pay

## 2024-02-21 ENCOUNTER — Encounter: Payer: Self-pay | Admitting: Pharmacist

## 2024-04-26 ENCOUNTER — Ambulatory Visit: Payer: Self-pay | Admitting: Family Medicine
